# Patient Record
Sex: Female | Born: 1950 | Smoking: Never smoker
Health system: Southern US, Community
[De-identification: ages and names within clinical notes are randomized; demographics above are authoritative.]

## PROBLEM LIST (undated history)

## (undated) DIAGNOSIS — R011 Cardiac murmur, unspecified: Secondary | ICD-10-CM

## (undated) DIAGNOSIS — Z86718 Personal history of other venous thrombosis and embolism: Secondary | ICD-10-CM

## (undated) DIAGNOSIS — C55 Malignant neoplasm of uterus, part unspecified: Secondary | ICD-10-CM

## (undated) DIAGNOSIS — L97529 Non-pressure chronic ulcer of other part of left foot with unspecified severity: Secondary | ICD-10-CM

## (undated) DIAGNOSIS — K429 Umbilical hernia without obstruction or gangrene: Secondary | ICD-10-CM

## (undated) DIAGNOSIS — I10 Essential (primary) hypertension: Secondary | ICD-10-CM

## (undated) DIAGNOSIS — I4891 Unspecified atrial fibrillation: Secondary | ICD-10-CM

## (undated) DIAGNOSIS — E119 Type 2 diabetes mellitus without complications: Secondary | ICD-10-CM

## (undated) DIAGNOSIS — E785 Hyperlipidemia, unspecified: Secondary | ICD-10-CM

## (undated) HISTORY — DX: Unspecified atrial fibrillation: I48.91

## (undated) HISTORY — PX: BREAST LUMPECTOMY: SHX2

## (undated) HISTORY — DX: Malignant neoplasm of uterus, part unspecified: C55

## (undated) HISTORY — DX: Hyperlipidemia, unspecified: E78.5

## (undated) HISTORY — DX: Essential (primary) hypertension: I10

## (undated) HISTORY — DX: Personal history of other venous thrombosis and embolism: Z86.718

## (undated) HISTORY — DX: Non-pressure chronic ulcer of other part of left foot with unspecified severity: L97.529

## (undated) HISTORY — DX: Cardiac murmur, unspecified: R01.1

## (undated) HISTORY — PX: TOTAL ABDOMINAL HYSTERECTOMY: SHX209

## (undated) HISTORY — DX: Type 2 diabetes mellitus without complications: E11.9

## (undated) HISTORY — DX: Umbilical hernia without obstruction or gangrene: K42.9

---

## 2015-03-18 LAB — HEMOGLOBIN A1C: HEMOGLOBIN A1C: 9.1 % — AB (ref 4.0–6.0)

## 2015-05-15 ENCOUNTER — Encounter: Payer: Self-pay | Attending: "Endocrinology | Admitting: Nutrition

## 2015-05-15 ENCOUNTER — Encounter: Payer: Self-pay | Admitting: Nutrition

## 2015-05-15 VITALS — Ht 71.0 in | Wt 338.2 lb

## 2015-05-15 DIAGNOSIS — E118 Type 2 diabetes mellitus with unspecified complications: Principal | ICD-10-CM

## 2015-05-15 DIAGNOSIS — E1165 Type 2 diabetes mellitus with hyperglycemia: Secondary | ICD-10-CM

## 2015-05-15 DIAGNOSIS — IMO0002 Reserved for concepts with insufficient information to code with codable children: Secondary | ICD-10-CM

## 2015-05-15 NOTE — Patient Instructions (Signed)
Goals:  1.  Follow Plate Method as discussed. 2. Increase fresh fruits and vegetables. 3. Avoid snacks between meals. 4. Drink only water with meals. 5. Measure foods out for portion control. 6. Take insulin before meals and at bedtime as prescribed. Do not skip insulin doses. 7. Lose 1 lb per week til next visit. 8. Get A1C down to 7.5% in three months.

## 2015-05-15 NOTE — Progress Notes (Signed)
  Medical Nutrition Therapy:  Appt start time: 7741 end time:  1630.   Assessment:  Primary concerns today: Diabetes. And Obesitity. Test blood sugars 4 times per day. A1C was 9%. FBS   100-200's( 43); 300-400's (33 )times. Had ulcers on foot in past. Does the shopping and  Cooking. Afraid she can't avoids snacks and giving up her favorite foods to improve blood sugars. Says she has gained 28 lbs since going on insulin. Eats three meals and snacks occasionally but admits to larger portions. Doesn't exercise due to ulcer and osteomyelitis in foot. A1C was 9%. Diet is excessive calories, fat, sodium and low in fresh fruits and vegetables  Preferred Learning Style:  No preference indicated   Learning Readiness:   Not ready  Contemplating  Ready  Change in progress   MEDICATIONS:See list   DIETARY INTAKE:  24-hr recall:  B ( AM): Eggs, bagel, cream cheese, jelly, Coffee and milk 2%. Snk ( AM): none  L ( PM): Chicken noodle soup,  Snk ( PM): chips or snacks. D ( PM):Stirfry with shrimp, rice, vegetables.  Water or Unswt  Snk ( PM):  Beverages: water, unswet tea,  Usual physical activity: walkins some but ulcers on her feet with osteomyelitis  Estimated energy needs: 1500 calories 170 g carbohydrates 112 g protein 42 g fat  Progress Towards Goal(s):  In progress.   Nutritional Diagnosis:   NB-1.1 Food and nutrition-related knowledge deficit As related to Diabetes.  As evidenced by A1C 9%..    Intervention:  Nutrition counseling on diabetes, CHo counting, meal planning, portion sizes, exercise, complications of DM and treatments, target ranges for blood sugars, importance of foot and dental care, proper use of insulin and working on weight loss with low fat high fiber low sodium diet.  Goals:  1.  Follow Plate Method as discussed. 2. Increase fresh fruits and vegetables. 3. Avoid snacks between meals. 4. Drink only water with meals. 5. Measure foods out for portion  control. 6. Take insulin before meals and at bedtime as prescribed. Do not skip insulin doses. 7. Lose 1 lb per week til next visit. 8. Get A1C down to 7.5% in three months.  Teaching Method Utilized:  Visual Auditory Hands on  Handouts given during visit include:  The Plate Method  Meal Plan Card   Barriers to learning/adherence to lifestyle change: problems with her feet limit her to exercise some.  Demonstrated degree of understanding via:  Teach Back   Monitoring/Evaluation:  Dietary intake, exercise, meal planning, SBG, and body weight in 1 month(s).

## 2015-07-05 ENCOUNTER — Ambulatory Visit: Payer: Self-pay | Admitting: Nutrition

## 2015-07-08 LAB — HEMOGLOBIN A1C: HEMOGLOBIN A1C: 10.2 % — AB (ref 4.0–6.0)

## 2015-07-20 ENCOUNTER — Encounter: Payer: Self-pay | Admitting: "Endocrinology

## 2015-07-20 DIAGNOSIS — G4730 Sleep apnea, unspecified: Secondary | ICD-10-CM | POA: Insufficient documentation

## 2015-07-20 DIAGNOSIS — F411 Generalized anxiety disorder: Secondary | ICD-10-CM | POA: Insufficient documentation

## 2015-07-20 DIAGNOSIS — F32A Depression, unspecified: Secondary | ICD-10-CM | POA: Insufficient documentation

## 2015-07-20 DIAGNOSIS — F329 Major depressive disorder, single episode, unspecified: Secondary | ICD-10-CM | POA: Insufficient documentation

## 2015-09-12 ENCOUNTER — Other Ambulatory Visit: Payer: Self-pay | Admitting: "Endocrinology

## 2015-09-12 ENCOUNTER — Other Ambulatory Visit: Payer: Self-pay

## 2015-09-12 MED ORDER — INSULIN GLARGINE 100 UNIT/ML SOLOSTAR PEN: 60.0000 [IU] | PEN_INJECTOR | Freq: Every day | SUBCUTANEOUS | Status: DC

## 2015-10-03 ENCOUNTER — Other Ambulatory Visit: Payer: Self-pay | Admitting: *Deleted

## 2015-10-03 DIAGNOSIS — N186 End stage renal disease: Principal | ICD-10-CM

## 2015-10-03 DIAGNOSIS — E1122 Type 2 diabetes mellitus with diabetic chronic kidney disease: Secondary | ICD-10-CM

## 2015-10-03 MED ORDER — BD PEN NEEDLE SHORT U/F 31G X 8 MM MISC: Status: DC

## 2015-10-04 ENCOUNTER — Other Ambulatory Visit: Payer: Self-pay

## 2015-10-04 MED ORDER — INSULIN PEN NEEDLE 31G X 4 MM MISC: 1.0000 | Freq: Four times a day (QID) | Status: AC

## 2015-10-18 ENCOUNTER — Ambulatory Visit: Payer: Self-pay | Admitting: "Endocrinology

## 2015-10-28 ENCOUNTER — Ambulatory Visit: Payer: Self-pay | Admitting: "Endocrinology

## 2015-10-30 LAB — HEMOGLOBIN A1C: Hgb A1c MFr Bld: 9.6 % — AB (ref 4.0–6.0)

## 2015-11-11 ENCOUNTER — Encounter: Payer: Self-pay | Admitting: "Endocrinology

## 2015-11-11 ENCOUNTER — Ambulatory Visit (INDEPENDENT_AMBULATORY_CARE_PROVIDER_SITE_OTHER): Payer: BLUE CROSS/BLUE SHIELD | Admitting: "Endocrinology

## 2015-11-11 VITALS — BP 162/101 | HR 69 | Ht 71.0 in | Wt 346.0 lb

## 2015-11-11 DIAGNOSIS — N181 Chronic kidney disease, stage 1: Secondary | ICD-10-CM

## 2015-11-11 DIAGNOSIS — E1122 Type 2 diabetes mellitus with diabetic chronic kidney disease: Secondary | ICD-10-CM | POA: Insufficient documentation

## 2015-11-11 DIAGNOSIS — I10 Essential (primary) hypertension: Secondary | ICD-10-CM | POA: Diagnosis not present

## 2015-11-11 DIAGNOSIS — Z9119 Patient's noncompliance with other medical treatment and regimen: Secondary | ICD-10-CM

## 2015-11-11 DIAGNOSIS — Z794 Long term (current) use of insulin: Secondary | ICD-10-CM | POA: Diagnosis not present

## 2015-11-11 DIAGNOSIS — Z91199 Patient's noncompliance with other medical treatment and regimen due to unspecified reason: Secondary | ICD-10-CM | POA: Insufficient documentation

## 2015-11-11 MED ORDER — INSULIN GLARGINE 100 UNIT/ML SOLOSTAR PEN: 70.0000 [IU] | PEN_INJECTOR | Freq: Every day | SUBCUTANEOUS | Status: DC

## 2015-11-11 MED ORDER — GLUCOSE BLOOD VI STRP: ORAL_STRIP | Status: AC

## 2015-11-11 MED ORDER — INSULIN GLARGINE 100 UNIT/ML SOLOSTAR PEN: 70.0000 [IU] | PEN_INJECTOR | Freq: Every day | SUBCUTANEOUS | Status: AC

## 2015-11-11 NOTE — Progress Notes (Signed)
Subjective:    Patient ID: Angelica Wilson, female    DOB: [birth year], PCP Josem Kaufmann, MD   Past Medical History  Diagnosis Date  . Diabetes mellitus without complication ([place])   . Hyperlipidemia   . Hypertension   . Atrial fibrillation ([place])   . Ulcer of left foot ([place])   . Hernia, umbilical   . Cardiac murmur   . Uterine cancer ([place])   . Hx of blood clots    Past Surgical History  Procedure Laterality Date  . Breast lumpectomy    . Total abdominal hysterectomy     Social History   Social History  . Marital Status: Unknown    Spouse Name: N/A  . Number of Children: N/A  . Years of Education: N/A   Social History Main Topics  . Smoking status: Never Smoker   . Smokeless tobacco: None  . Alcohol Use: No  . Drug Use: No  . Sexual Activity: Not Asked   Other Topics Concern  . None   Social History Narrative   Outpatient Encounter Prescriptions as of 11/11/2015  Medication Sig  . Alpha-Lipoic Acid 600 MG CAPS Take by mouth daily.  Marland Kitchen ALPRAZolam (XANAX) 0.5 MG tablet Take 0.5 mg by mouth 3 (three) times daily as needed for anxiety.  Marland Kitchen amLODipine (NORVASC) 5 MG tablet Take 5 mg by mouth daily.  Marland Kitchen aspirin 81 MG tablet Take 81 mg by mouth daily.  . Cholecalciferol (VITAMIN D3) 2000 UNITS TABS Take 6,000 Units by mouth daily.   . hydrALAZINE (APRESOLINE) 25 MG tablet Take 25 mg by mouth 2 (two) times daily as needed.  . hydrochlorothiazide (HYDRODIURIL) 12.5 MG tablet Take 12.5 mg by mouth daily.  . Insulin Glargine (LANTUS SOLOSTAR) 100 UNIT/ML Solostar Pen Inject 70 Units into the skin at bedtime.  . insulin lispro (HUMALOG) 100 UNIT/ML injection Inject 18-24 Units into the skin 3 (three) times daily before meals.  . Insulin Pen Needle 31G X 4 MM MISC 1 each by Does not apply route 4 (four) times daily. Use 4 x daily  . l-methylfolate-B6-B12 (METANX) 3-35-2 MG TABS Take 1 tablet by mouth 2 (two) times daily.  . Lactobacillus-Inulin ([place])  Take by mouth daily.  . metFORMIN (GLUCOPHAGE) 500 MG tablet Take by mouth 2 (two) times daily with a meal.  . rivaroxaban (XARELTO) 20 MG TABS tablet Take 20 mg by mouth daily with supper.  . valsartan-hydrochlorothiazide (DIOVAN-HCT) 320-12.5 MG per tablet Take 1 tablet by mouth daily.  . vitamin C (ASCORBIC ACID) 500 MG tablet Take 500 mg by mouth 3 (three) times daily.  . [DISCONTINUED] Insulin Glargine (LANTUS SOLOSTAR) 100 UNIT/ML Solostar Pen Inject 60 Units into the skin at bedtime.  . [DISCONTINUED] Insulin Glargine (LANTUS SOLOSTAR) 100 UNIT/ML Solostar Pen Inject 70 Units into the skin at bedtime.  . [DISCONTINUED] Insulin Glargine (LANTUS SOLOSTAR) 100 UNIT/ML Solostar Pen Inject 70 Units into the skin at bedtime.  . [DISCONTINUED] Insulin Glargine (LANTUS SOLOSTAR) 100 UNIT/ML Solostar Pen Inject 70 Units into the skin at bedtime.  Marland Kitchen glucose blood test strip Use as instructed   No facility-administered encounter medications on file as of 11/11/2015.   ALLERGIES: No Known Allergies VACCINATION STATUS: Immunization History  Administered Date(s) Administered  . Influenza-Unspecified 07/19/2014    Diabetes She presents for her follow-up diabetic visit. She has type 2 diabetes mellitus. Onset time: She was diagnosed at approximate age of 13 years. Her disease course has been improving. There  are no hypoglycemic associated symptoms. Pertinent negatives for hypoglycemia include no confusion, headaches, pallor or seizures. Associated symptoms include fatigue, polydipsia and polyuria. Pertinent negatives for diabetes include no chest pain and no polyphagia. There are no hypoglycemic complications. Symptoms are improving. Diabetic complications include nephropathy and peripheral neuropathy. Risk factors for coronary artery disease include diabetes mellitus, dyslipidemia, obesity and sedentary lifestyle. Current diabetic treatment includes intensive insulin program and oral agent  (monotherapy). She is compliant with treatment some of the time. Her weight is stable. She is following a generally unhealthy diet. When asked about meal planning, she reported none. Prior visit with dietitian: She declines dietitian referral. Her home blood glucose trend is fluctuating dramatically. Her breakfast blood glucose range is generally >200 mg/dl. Her lunch blood glucose range is generally >200 mg/dl. Her dinner blood glucose range is generally >200 mg/dl. Her overall blood glucose range is >200 mg/dl. An ACE inhibitor/angiotensin II receptor blocker is being taken. Eye exam is current.  Hyperlipidemia This is a chronic problem. The current episode started more than 1 year ago. Pertinent negatives include no chest pain, leg pain, myalgias or shortness of breath. Risk factors for coronary artery disease include dyslipidemia, diabetes mellitus, hypertension, obesity and a sedentary lifestyle.  Hypertension This is a chronic problem. The current episode started more than 1 year ago. Pertinent negatives include no chest pain, headaches, palpitations or shortness of breath. Risk factors for coronary artery disease include dyslipidemia, diabetes mellitus, obesity and sedentary lifestyle. Past treatments include angiotensin blockers. Compliance problems include diet, exercise and psychosocial issues.  Hypertensive end-organ damage includes kidney disease.     Review of Systems  Constitutional: Positive for fatigue. Negative for unexpected weight change.  HENT: Negative for trouble swallowing and voice change.   Eyes: Negative for visual disturbance.  Respiratory: Negative for cough, shortness of breath and wheezing.   Cardiovascular: Negative for chest pain, palpitations and leg swelling.  Gastrointestinal: Negative for nausea, vomiting and diarrhea.  Endocrine: Positive for polydipsia and polyuria. Negative for cold intolerance, heat intolerance and polyphagia.  Musculoskeletal: Negative for  myalgias and arthralgias.  Skin: Negative for color change, pallor, rash and wound.  Neurological: Negative for seizures and headaches.  Psychiatric/Behavioral: Negative for suicidal ideas and confusion.    Objective:    BP 162/101 mmHg  Pulse 69  Ht 5\' 11"  (1.803 m)  Wt 346 lb (156.945 kg)  BMI 48.28 kg/m2  SpO2 97%  Wt Readings from Last 3 Encounters:  11/11/15 346 lb (156.945 kg)  07/18/15 330 lb (149.687 kg)  05/15/15 338 lb 3.2 oz (153.407 kg)    Physical Exam  Constitutional: She is oriented to person, place, and time. She appears well-developed.  HENT:  Head: Normocephalic and atraumatic.  Eyes: EOM are normal.  Neck: Normal range of motion. Neck supple. No tracheal deviation present. No thyromegaly present.  Cardiovascular: Normal rate and regular rhythm.   Pulmonary/Chest: Effort normal and breath sounds normal.  Abdominal: Soft. Bowel sounds are normal. There is no tenderness. There is no guarding.  Musculoskeletal: Normal range of motion. She exhibits no edema.       Right ankle: She exhibits swelling.       Left ankle: She exhibits swelling.       Right foot: There is swelling.       Left foot: There is swelling.  Neurological: She is alert and oriented to person, place, and time. She has normal reflexes. No cranial nerve deficit. Coordination normal.  Skin: Skin is warm  and dry. No rash noted. No erythema. No pallor.  Psychiatric: She has a normal mood and affect. Judgment normal.    Results for orders placed or performed in visit on 11/11/15  Hemoglobin A1c  Result Value Ref Range   Hgb A1c MFr Bld 9.6 (A) 4.0 - 6.0 %   Complete Blood Count (Most recent): No results found for: WBC, HGB, HCT, MCV, PLT Chemistry (most recent): No results found for: NA, K, CL, CO2, BUN, CREATININE, GLUF Diabetic Labs (most recent): Lab Results  Component Value Date   HGBA1C 9.6* 10/30/2015   HGBA1C 10.2* 07/08/2015   HGBA1C 9.1* 03/18/2015     Assessment & Plan:    1. Type 2 diabetes mellitus with stage 1 chronic kidney disease, with long-term current use of insulin (HCC)   - Her diabetes is  complicated by stage II CK D and patient remains at a high risk for more acute and chronic complications of diabetes which include CAD, CVA, CKD, retinopathy, and neuropathy. These are all discussed in detail with the patient.  Patient came with improved but still persistently above target glucose profile, and  recent A1c of 9.6 %.  Glucose logs and insulin administration records pertaining to this visit,  to be scanned into patient's records.  Recent labs reviewed.   - I have re-counseled the patient on diet management and weight loss  by adopting a carbohydrate restricted / protein rich  Diet.  - Suggestion is made for patient to avoid simple carbohydrates   from their diet including Cakes , Desserts, Ice Cream,  Soda (  diet and regular) , Sweet Tea , Candies,  Chips, Cookies, Artificial Sweeteners,   and "Sugar-free" Products .  This will help patient to have stable blood glucose profile and potentially avoid unintended  Weight gain.  - Patient is advised to stick to a routine mealtimes to eat 3 meals  a day and avoid unnecessary snacks ( to snack only to correct hypoglycemia).  - The patient  declined referral to a CDE for individualized DM education.  - I have approached patient with the following individualized plan to manage diabetes and patient agrees.  -I advised her to increase Lantus to 70 units qhs, and prandial insulin Novolog to 18 units TIDAC for pre-meal BG readings of 90-150mg /dl, plus patient specific correction dose of rapid acting insulin for unexpected hyperglycemia above 150mg /dl, associated with strict monitoring of BG AC and HS.  She has to commit to strict monitoring before increasing insulin for safe use. -Adjustment parameters for hypo and hyperglycemia were given in a written document to patient. -Patient is encouraged to call clinic  for blood glucose levels less than 70 or above 300 mg /dl. -She will be considered for incretin therapy once compliance is assured.  I advised her to continue MTF 500mg  po BID.   - Patient specific target  for A1c; LDL, HDL, Triglycerides, and  Waist Circumference were discussed in detail.  2) BP/HTNuncontrolled. Continue current medications including ACEI/ARB. 3) Lipids/HPL:  She is not on statins, will consider next visit. 4)  Weight/Diet: CDE consult in progress, exercise, and carbohydrates information provided.  5) Personal history of noncompliance with medical treatment, presenting hazards to health -He is counseled extensively to be more engaged in the care of diabetes to prevent competitions.  6) Chronic Care/Health Maintenance:  -Patient is on ACEI/ARB  and encouraged to continue to follow up with Ophthalmology, Podiatrist at least yearly or according to recommendations, and advised to  stay  away from smoking. I have recommended yearly flu vaccine and pneumonia vaccination at least every 5 years; moderate intensity exercise for up to 150 minutes weekly; and  sleep for at least 7 hours a day.  - 25 minutes of time was spent on the care of this patient , 50% of which was applied for counseling on diabetes complications and their preventions.  - I advised patient to maintain close follow up with [facility], MD for primary care needs.  Patient is asked to bring meter and  blood glucose logs during their next visit.   Follow up plan: -Return in about 3 months (around 02/09/2016) for diabetes, high blood pressure, high cholesterol, follow up with pre-visit labs, meter, and logs.  Glade Lloyd, MD Phone: [phone]  Fax: [phone]   11/11/2015, 7:58 PM

## 2015-11-11 NOTE — Patient Instructions (Signed)

## 2015-11-19 ENCOUNTER — Other Ambulatory Visit: Payer: Self-pay | Admitting: "Endocrinology

## 2015-12-05 DIAGNOSIS — G629 Polyneuropathy, unspecified: Secondary | ICD-10-CM | POA: Diagnosis not present

## 2015-12-05 DIAGNOSIS — Z91030 Bee allergy status: Secondary | ICD-10-CM | POA: Diagnosis not present

## 2015-12-05 DIAGNOSIS — E11622 Type 2 diabetes mellitus with other skin ulcer: Secondary | ICD-10-CM | POA: Diagnosis not present

## 2015-12-05 DIAGNOSIS — I10 Essential (primary) hypertension: Secondary | ICD-10-CM | POA: Diagnosis not present

## 2015-12-05 DIAGNOSIS — L97321 Non-pressure chronic ulcer of left ankle limited to breakdown of skin: Secondary | ICD-10-CM | POA: Diagnosis not present

## 2015-12-05 DIAGNOSIS — I519 Heart disease, unspecified: Secondary | ICD-10-CM | POA: Diagnosis not present

## 2015-12-05 DIAGNOSIS — E1169 Type 2 diabetes mellitus with other specified complication: Secondary | ICD-10-CM | POA: Diagnosis not present

## 2015-12-05 DIAGNOSIS — Z7901 Long term (current) use of anticoagulants: Secondary | ICD-10-CM | POA: Diagnosis not present

## 2015-12-05 DIAGNOSIS — Z794 Long term (current) use of insulin: Secondary | ICD-10-CM | POA: Diagnosis not present

## 2015-12-05 DIAGNOSIS — L97221 Non-pressure chronic ulcer of left calf limited to breakdown of skin: Secondary | ICD-10-CM | POA: Diagnosis not present

## 2015-12-05 DIAGNOSIS — L97211 Non-pressure chronic ulcer of right calf limited to breakdown of skin: Secondary | ICD-10-CM | POA: Diagnosis not present

## 2015-12-05 DIAGNOSIS — E11621 Type 2 diabetes mellitus with foot ulcer: Secondary | ICD-10-CM | POA: Diagnosis not present

## 2015-12-05 DIAGNOSIS — L97311 Non-pressure chronic ulcer of right ankle limited to breakdown of skin: Secondary | ICD-10-CM | POA: Diagnosis not present

## 2015-12-05 DIAGNOSIS — Z792 Long term (current) use of antibiotics: Secondary | ICD-10-CM | POA: Diagnosis not present

## 2015-12-05 DIAGNOSIS — I872 Venous insufficiency (chronic) (peripheral): Secondary | ICD-10-CM | POA: Diagnosis not present

## 2015-12-06 DIAGNOSIS — E11622 Type 2 diabetes mellitus with other skin ulcer: Secondary | ICD-10-CM | POA: Diagnosis not present

## 2015-12-06 DIAGNOSIS — L97211 Non-pressure chronic ulcer of right calf limited to breakdown of skin: Secondary | ICD-10-CM | POA: Diagnosis not present

## 2015-12-06 DIAGNOSIS — I872 Venous insufficiency (chronic) (peripheral): Secondary | ICD-10-CM | POA: Diagnosis not present

## 2015-12-06 DIAGNOSIS — L97221 Non-pressure chronic ulcer of left calf limited to breakdown of skin: Secondary | ICD-10-CM | POA: Diagnosis not present

## 2015-12-06 DIAGNOSIS — E11621 Type 2 diabetes mellitus with foot ulcer: Secondary | ICD-10-CM | POA: Diagnosis not present

## 2015-12-06 DIAGNOSIS — E1169 Type 2 diabetes mellitus with other specified complication: Secondary | ICD-10-CM | POA: Diagnosis not present

## 2015-12-06 DIAGNOSIS — L97311 Non-pressure chronic ulcer of right ankle limited to breakdown of skin: Secondary | ICD-10-CM | POA: Diagnosis not present

## 2015-12-06 DIAGNOSIS — L97321 Non-pressure chronic ulcer of left ankle limited to breakdown of skin: Secondary | ICD-10-CM | POA: Diagnosis not present

## 2015-12-11 DIAGNOSIS — E11621 Type 2 diabetes mellitus with foot ulcer: Secondary | ICD-10-CM | POA: Diagnosis not present

## 2015-12-11 DIAGNOSIS — E1169 Type 2 diabetes mellitus with other specified complication: Secondary | ICD-10-CM | POA: Diagnosis not present

## 2015-12-11 DIAGNOSIS — L97321 Non-pressure chronic ulcer of left ankle limited to breakdown of skin: Secondary | ICD-10-CM | POA: Diagnosis not present

## 2015-12-11 DIAGNOSIS — L97311 Non-pressure chronic ulcer of right ankle limited to breakdown of skin: Secondary | ICD-10-CM | POA: Diagnosis not present

## 2015-12-11 DIAGNOSIS — E11622 Type 2 diabetes mellitus with other skin ulcer: Secondary | ICD-10-CM | POA: Diagnosis not present

## 2015-12-11 DIAGNOSIS — L97211 Non-pressure chronic ulcer of right calf limited to breakdown of skin: Secondary | ICD-10-CM | POA: Diagnosis not present

## 2015-12-11 DIAGNOSIS — I872 Venous insufficiency (chronic) (peripheral): Secondary | ICD-10-CM | POA: Diagnosis not present

## 2015-12-11 DIAGNOSIS — L97221 Non-pressure chronic ulcer of left calf limited to breakdown of skin: Secondary | ICD-10-CM | POA: Diagnosis not present

## 2015-12-14 DIAGNOSIS — Z7901 Long term (current) use of anticoagulants: Secondary | ICD-10-CM | POA: Diagnosis not present

## 2015-12-14 DIAGNOSIS — Z8542 Personal history of malignant neoplasm of other parts of uterus: Secondary | ICD-10-CM | POA: Diagnosis not present

## 2015-12-14 DIAGNOSIS — E7800 Pure hypercholesterolemia, unspecified: Secondary | ICD-10-CM | POA: Diagnosis not present

## 2015-12-14 DIAGNOSIS — Z794 Long term (current) use of insulin: Secondary | ICD-10-CM | POA: Diagnosis not present

## 2015-12-14 DIAGNOSIS — E119 Type 2 diabetes mellitus without complications: Secondary | ICD-10-CM | POA: Diagnosis not present

## 2015-12-14 DIAGNOSIS — F329 Major depressive disorder, single episode, unspecified: Secondary | ICD-10-CM | POA: Diagnosis not present

## 2015-12-14 DIAGNOSIS — I4891 Unspecified atrial fibrillation: Secondary | ICD-10-CM | POA: Diagnosis not present

## 2015-12-14 DIAGNOSIS — L03031 Cellulitis of right toe: Secondary | ICD-10-CM | POA: Diagnosis not present

## 2015-12-14 DIAGNOSIS — Z79899 Other long term (current) drug therapy: Secondary | ICD-10-CM | POA: Diagnosis not present

## 2015-12-14 DIAGNOSIS — Z86718 Personal history of other venous thrombosis and embolism: Secondary | ICD-10-CM | POA: Diagnosis not present

## 2015-12-14 DIAGNOSIS — Z91030 Bee allergy status: Secondary | ICD-10-CM | POA: Diagnosis not present

## 2015-12-17 DIAGNOSIS — L97221 Non-pressure chronic ulcer of left calf limited to breakdown of skin: Secondary | ICD-10-CM | POA: Diagnosis not present

## 2015-12-17 DIAGNOSIS — E11622 Type 2 diabetes mellitus with other skin ulcer: Secondary | ICD-10-CM | POA: Diagnosis not present

## 2015-12-17 DIAGNOSIS — E11621 Type 2 diabetes mellitus with foot ulcer: Secondary | ICD-10-CM | POA: Diagnosis not present

## 2015-12-17 DIAGNOSIS — E1169 Type 2 diabetes mellitus with other specified complication: Secondary | ICD-10-CM | POA: Diagnosis not present

## 2015-12-17 DIAGNOSIS — L97321 Non-pressure chronic ulcer of left ankle limited to breakdown of skin: Secondary | ICD-10-CM | POA: Diagnosis not present

## 2015-12-17 DIAGNOSIS — I872 Venous insufficiency (chronic) (peripheral): Secondary | ICD-10-CM | POA: Diagnosis not present

## 2015-12-17 DIAGNOSIS — L97311 Non-pressure chronic ulcer of right ankle limited to breakdown of skin: Secondary | ICD-10-CM | POA: Diagnosis not present

## 2015-12-17 DIAGNOSIS — L97211 Non-pressure chronic ulcer of right calf limited to breakdown of skin: Secondary | ICD-10-CM | POA: Diagnosis not present

## 2015-12-23 DIAGNOSIS — L97221 Non-pressure chronic ulcer of left calf limited to breakdown of skin: Secondary | ICD-10-CM | POA: Diagnosis not present

## 2015-12-23 DIAGNOSIS — L97311 Non-pressure chronic ulcer of right ankle limited to breakdown of skin: Secondary | ICD-10-CM | POA: Diagnosis not present

## 2015-12-23 DIAGNOSIS — L97321 Non-pressure chronic ulcer of left ankle limited to breakdown of skin: Secondary | ICD-10-CM | POA: Diagnosis not present

## 2015-12-23 DIAGNOSIS — E1169 Type 2 diabetes mellitus with other specified complication: Secondary | ICD-10-CM | POA: Diagnosis not present

## 2015-12-23 DIAGNOSIS — E11621 Type 2 diabetes mellitus with foot ulcer: Secondary | ICD-10-CM | POA: Diagnosis not present

## 2015-12-23 DIAGNOSIS — L97211 Non-pressure chronic ulcer of right calf limited to breakdown of skin: Secondary | ICD-10-CM | POA: Diagnosis not present

## 2015-12-23 DIAGNOSIS — E11622 Type 2 diabetes mellitus with other skin ulcer: Secondary | ICD-10-CM | POA: Diagnosis not present

## 2015-12-23 DIAGNOSIS — I872 Venous insufficiency (chronic) (peripheral): Secondary | ICD-10-CM | POA: Diagnosis not present

## 2015-12-30 DIAGNOSIS — E11621 Type 2 diabetes mellitus with foot ulcer: Secondary | ICD-10-CM | POA: Diagnosis not present

## 2015-12-30 DIAGNOSIS — E11622 Type 2 diabetes mellitus with other skin ulcer: Secondary | ICD-10-CM | POA: Diagnosis not present

## 2015-12-30 DIAGNOSIS — L97321 Non-pressure chronic ulcer of left ankle limited to breakdown of skin: Secondary | ICD-10-CM | POA: Diagnosis not present

## 2015-12-30 DIAGNOSIS — L97221 Non-pressure chronic ulcer of left calf limited to breakdown of skin: Secondary | ICD-10-CM | POA: Diagnosis not present

## 2015-12-30 DIAGNOSIS — E1169 Type 2 diabetes mellitus with other specified complication: Secondary | ICD-10-CM | POA: Diagnosis not present

## 2015-12-30 DIAGNOSIS — L97211 Non-pressure chronic ulcer of right calf limited to breakdown of skin: Secondary | ICD-10-CM | POA: Diagnosis not present

## 2015-12-30 DIAGNOSIS — I872 Venous insufficiency (chronic) (peripheral): Secondary | ICD-10-CM | POA: Diagnosis not present

## 2015-12-30 DIAGNOSIS — L97311 Non-pressure chronic ulcer of right ankle limited to breakdown of skin: Secondary | ICD-10-CM | POA: Diagnosis not present

## 2016-01-06 DIAGNOSIS — Z794 Long term (current) use of insulin: Secondary | ICD-10-CM | POA: Diagnosis not present

## 2016-01-06 DIAGNOSIS — Z7901 Long term (current) use of anticoagulants: Secondary | ICD-10-CM | POA: Diagnosis not present

## 2016-01-06 DIAGNOSIS — Z792 Long term (current) use of antibiotics: Secondary | ICD-10-CM | POA: Diagnosis not present

## 2016-01-06 DIAGNOSIS — Z91030 Bee allergy status: Secondary | ICD-10-CM | POA: Diagnosis not present

## 2016-01-06 DIAGNOSIS — I10 Essential (primary) hypertension: Secondary | ICD-10-CM | POA: Diagnosis not present

## 2016-01-06 DIAGNOSIS — I872 Venous insufficiency (chronic) (peripheral): Secondary | ICD-10-CM | POA: Diagnosis not present

## 2016-01-06 DIAGNOSIS — E1169 Type 2 diabetes mellitus with other specified complication: Secondary | ICD-10-CM | POA: Diagnosis not present

## 2016-01-06 DIAGNOSIS — L97529 Non-pressure chronic ulcer of other part of left foot with unspecified severity: Secondary | ICD-10-CM | POA: Diagnosis not present

## 2016-01-06 DIAGNOSIS — E11622 Type 2 diabetes mellitus with other skin ulcer: Secondary | ICD-10-CM | POA: Diagnosis not present

## 2016-01-06 DIAGNOSIS — I519 Heart disease, unspecified: Secondary | ICD-10-CM | POA: Diagnosis not present

## 2016-01-06 DIAGNOSIS — G629 Polyneuropathy, unspecified: Secondary | ICD-10-CM | POA: Diagnosis not present

## 2016-01-13 DIAGNOSIS — I10 Essential (primary) hypertension: Secondary | ICD-10-CM | POA: Diagnosis not present

## 2016-01-13 DIAGNOSIS — I519 Heart disease, unspecified: Secondary | ICD-10-CM | POA: Diagnosis not present

## 2016-01-13 DIAGNOSIS — I872 Venous insufficiency (chronic) (peripheral): Secondary | ICD-10-CM | POA: Diagnosis not present

## 2016-01-13 DIAGNOSIS — G629 Polyneuropathy, unspecified: Secondary | ICD-10-CM | POA: Diagnosis not present

## 2016-01-13 DIAGNOSIS — E11622 Type 2 diabetes mellitus with other skin ulcer: Secondary | ICD-10-CM | POA: Diagnosis not present

## 2016-01-13 DIAGNOSIS — E1169 Type 2 diabetes mellitus with other specified complication: Secondary | ICD-10-CM | POA: Diagnosis not present

## 2016-01-13 DIAGNOSIS — L97529 Non-pressure chronic ulcer of other part of left foot with unspecified severity: Secondary | ICD-10-CM | POA: Diagnosis not present

## 2016-01-20 DIAGNOSIS — E11622 Type 2 diabetes mellitus with other skin ulcer: Secondary | ICD-10-CM | POA: Diagnosis not present

## 2016-01-20 DIAGNOSIS — G629 Polyneuropathy, unspecified: Secondary | ICD-10-CM | POA: Diagnosis not present

## 2016-01-20 DIAGNOSIS — I872 Venous insufficiency (chronic) (peripheral): Secondary | ICD-10-CM | POA: Diagnosis not present

## 2016-01-20 DIAGNOSIS — E1169 Type 2 diabetes mellitus with other specified complication: Secondary | ICD-10-CM | POA: Diagnosis not present

## 2016-01-20 DIAGNOSIS — L97529 Non-pressure chronic ulcer of other part of left foot with unspecified severity: Secondary | ICD-10-CM | POA: Diagnosis not present

## 2016-01-20 DIAGNOSIS — I10 Essential (primary) hypertension: Secondary | ICD-10-CM | POA: Diagnosis not present

## 2016-01-20 DIAGNOSIS — I519 Heart disease, unspecified: Secondary | ICD-10-CM | POA: Diagnosis not present

## 2016-01-27 DIAGNOSIS — L97529 Non-pressure chronic ulcer of other part of left foot with unspecified severity: Secondary | ICD-10-CM | POA: Diagnosis not present

## 2016-01-27 DIAGNOSIS — E1169 Type 2 diabetes mellitus with other specified complication: Secondary | ICD-10-CM | POA: Diagnosis not present

## 2016-01-27 DIAGNOSIS — I519 Heart disease, unspecified: Secondary | ICD-10-CM | POA: Diagnosis not present

## 2016-01-27 DIAGNOSIS — E11622 Type 2 diabetes mellitus with other skin ulcer: Secondary | ICD-10-CM | POA: Diagnosis not present

## 2016-01-27 DIAGNOSIS — I872 Venous insufficiency (chronic) (peripheral): Secondary | ICD-10-CM | POA: Diagnosis not present

## 2016-01-27 DIAGNOSIS — I10 Essential (primary) hypertension: Secondary | ICD-10-CM | POA: Diagnosis not present

## 2016-01-27 DIAGNOSIS — G629 Polyneuropathy, unspecified: Secondary | ICD-10-CM | POA: Diagnosis not present

## 2016-02-03 DIAGNOSIS — G629 Polyneuropathy, unspecified: Secondary | ICD-10-CM | POA: Diagnosis not present

## 2016-02-03 DIAGNOSIS — L97521 Non-pressure chronic ulcer of other part of left foot limited to breakdown of skin: Secondary | ICD-10-CM | POA: Diagnosis not present

## 2016-02-03 DIAGNOSIS — Z91030 Bee allergy status: Secondary | ICD-10-CM | POA: Diagnosis not present

## 2016-02-03 DIAGNOSIS — E669 Obesity, unspecified: Secondary | ICD-10-CM | POA: Diagnosis not present

## 2016-02-03 DIAGNOSIS — E1169 Type 2 diabetes mellitus with other specified complication: Secondary | ICD-10-CM | POA: Diagnosis not present

## 2016-02-03 DIAGNOSIS — I519 Heart disease, unspecified: Secondary | ICD-10-CM | POA: Diagnosis not present

## 2016-02-03 DIAGNOSIS — E11622 Type 2 diabetes mellitus with other skin ulcer: Secondary | ICD-10-CM | POA: Diagnosis not present

## 2016-02-03 DIAGNOSIS — Z7901 Long term (current) use of anticoagulants: Secondary | ICD-10-CM | POA: Diagnosis not present

## 2016-02-03 DIAGNOSIS — I10 Essential (primary) hypertension: Secondary | ICD-10-CM | POA: Diagnosis not present

## 2016-02-03 DIAGNOSIS — Z794 Long term (current) use of insulin: Secondary | ICD-10-CM | POA: Diagnosis not present

## 2016-02-03 DIAGNOSIS — Z792 Long term (current) use of antibiotics: Secondary | ICD-10-CM | POA: Diagnosis not present

## 2016-02-03 DIAGNOSIS — I872 Venous insufficiency (chronic) (peripheral): Secondary | ICD-10-CM | POA: Diagnosis not present

## 2016-02-07 DIAGNOSIS — L97521 Non-pressure chronic ulcer of other part of left foot limited to breakdown of skin: Secondary | ICD-10-CM | POA: Diagnosis not present

## 2016-02-07 DIAGNOSIS — E1169 Type 2 diabetes mellitus with other specified complication: Secondary | ICD-10-CM | POA: Diagnosis not present

## 2016-02-07 DIAGNOSIS — I10 Essential (primary) hypertension: Secondary | ICD-10-CM | POA: Diagnosis not present

## 2016-02-07 DIAGNOSIS — E11622 Type 2 diabetes mellitus with other skin ulcer: Secondary | ICD-10-CM | POA: Diagnosis not present

## 2016-02-07 DIAGNOSIS — I872 Venous insufficiency (chronic) (peripheral): Secondary | ICD-10-CM | POA: Diagnosis not present

## 2016-02-07 DIAGNOSIS — I519 Heart disease, unspecified: Secondary | ICD-10-CM | POA: Diagnosis not present

## 2016-02-07 DIAGNOSIS — Z7901 Long term (current) use of anticoagulants: Secondary | ICD-10-CM | POA: Diagnosis not present

## 2016-02-12 ENCOUNTER — Ambulatory Visit: Payer: BLUE CROSS/BLUE SHIELD | Admitting: "Endocrinology

## 2016-02-17 DIAGNOSIS — Z7901 Long term (current) use of anticoagulants: Secondary | ICD-10-CM | POA: Diagnosis not present

## 2016-02-17 DIAGNOSIS — L97521 Non-pressure chronic ulcer of other part of left foot limited to breakdown of skin: Secondary | ICD-10-CM | POA: Diagnosis not present

## 2016-02-17 DIAGNOSIS — E11622 Type 2 diabetes mellitus with other skin ulcer: Secondary | ICD-10-CM | POA: Diagnosis not present

## 2016-02-17 DIAGNOSIS — I10 Essential (primary) hypertension: Secondary | ICD-10-CM | POA: Diagnosis not present

## 2016-02-17 DIAGNOSIS — I519 Heart disease, unspecified: Secondary | ICD-10-CM | POA: Diagnosis not present

## 2016-02-17 DIAGNOSIS — I872 Venous insufficiency (chronic) (peripheral): Secondary | ICD-10-CM | POA: Diagnosis not present

## 2016-02-17 DIAGNOSIS — E1169 Type 2 diabetes mellitus with other specified complication: Secondary | ICD-10-CM | POA: Diagnosis not present

## 2016-02-24 DIAGNOSIS — Z7901 Long term (current) use of anticoagulants: Secondary | ICD-10-CM | POA: Diagnosis not present

## 2016-02-24 DIAGNOSIS — I872 Venous insufficiency (chronic) (peripheral): Secondary | ICD-10-CM | POA: Diagnosis not present

## 2016-02-24 DIAGNOSIS — E11622 Type 2 diabetes mellitus with other skin ulcer: Secondary | ICD-10-CM | POA: Diagnosis not present

## 2016-02-24 DIAGNOSIS — I519 Heart disease, unspecified: Secondary | ICD-10-CM | POA: Diagnosis not present

## 2016-02-24 DIAGNOSIS — L97521 Non-pressure chronic ulcer of other part of left foot limited to breakdown of skin: Secondary | ICD-10-CM | POA: Diagnosis not present

## 2016-02-24 DIAGNOSIS — I10 Essential (primary) hypertension: Secondary | ICD-10-CM | POA: Diagnosis not present

## 2016-02-24 DIAGNOSIS — E1169 Type 2 diabetes mellitus with other specified complication: Secondary | ICD-10-CM | POA: Diagnosis not present

## 2016-03-02 DIAGNOSIS — E11621 Type 2 diabetes mellitus with foot ulcer: Secondary | ICD-10-CM | POA: Diagnosis not present

## 2016-03-02 DIAGNOSIS — Z7901 Long term (current) use of anticoagulants: Secondary | ICD-10-CM | POA: Diagnosis not present

## 2016-03-02 DIAGNOSIS — Z794 Long term (current) use of insulin: Secondary | ICD-10-CM | POA: Diagnosis not present

## 2016-03-02 DIAGNOSIS — E1169 Type 2 diabetes mellitus with other specified complication: Secondary | ICD-10-CM | POA: Diagnosis not present

## 2016-03-02 DIAGNOSIS — I519 Heart disease, unspecified: Secondary | ICD-10-CM | POA: Diagnosis not present

## 2016-03-02 DIAGNOSIS — Z91030 Bee allergy status: Secondary | ICD-10-CM | POA: Diagnosis not present

## 2016-03-02 DIAGNOSIS — I10 Essential (primary) hypertension: Secondary | ICD-10-CM | POA: Diagnosis not present

## 2016-03-02 DIAGNOSIS — I872 Venous insufficiency (chronic) (peripheral): Secondary | ICD-10-CM | POA: Diagnosis not present

## 2016-03-02 DIAGNOSIS — E11622 Type 2 diabetes mellitus with other skin ulcer: Secondary | ICD-10-CM | POA: Diagnosis not present

## 2016-03-02 DIAGNOSIS — R609 Edema, unspecified: Secondary | ICD-10-CM | POA: Diagnosis not present

## 2016-03-02 DIAGNOSIS — Z792 Long term (current) use of antibiotics: Secondary | ICD-10-CM | POA: Diagnosis not present

## 2016-03-04 ENCOUNTER — Other Ambulatory Visit: Payer: Self-pay | Admitting: "Endocrinology

## 2016-03-04 DIAGNOSIS — Z794 Long term (current) use of insulin: Secondary | ICD-10-CM | POA: Diagnosis not present

## 2016-03-04 DIAGNOSIS — N181 Chronic kidney disease, stage 1: Secondary | ICD-10-CM | POA: Diagnosis not present

## 2016-03-04 DIAGNOSIS — E1122 Type 2 diabetes mellitus with diabetic chronic kidney disease: Secondary | ICD-10-CM | POA: Diagnosis not present

## 2016-03-04 LAB — HEMOGLOBIN A1C
Hgb A1c MFr Bld: 8.7 % — ABNORMAL HIGH (ref <5.7)
Mean Plasma Glucose: 203 mg/dL

## 2016-03-04 LAB — BASIC METABOLIC PANEL
BUN: 22 mg/dL (ref 7–25)
CALCIUM: 9.5 mg/dL (ref 8.6–10.4)
CHLORIDE: 106 mmol/L (ref 98–110)
CO2: 25 mmol/L (ref 20–31)
CREATININE: 1.03 mg/dL — AB (ref 0.50–0.99)
Glucose, Bld: 194 mg/dL — ABNORMAL HIGH (ref 65–99)
Potassium: 4.2 mmol/L (ref 3.5–5.3)
Sodium: 140 mmol/L (ref 135–146)

## 2016-03-05 LAB — T4, FREE: FREE T4: 1.1 ng/dL (ref 0.8–1.8)

## 2016-03-05 LAB — TSH: TSH: 2.69 mIU/L

## 2016-03-06 ENCOUNTER — Other Ambulatory Visit: Payer: Self-pay | Admitting: "Endocrinology

## 2016-03-09 DIAGNOSIS — E11621 Type 2 diabetes mellitus with foot ulcer: Secondary | ICD-10-CM | POA: Diagnosis not present

## 2016-03-09 DIAGNOSIS — E1169 Type 2 diabetes mellitus with other specified complication: Secondary | ICD-10-CM | POA: Diagnosis not present

## 2016-03-09 DIAGNOSIS — Z794 Long term (current) use of insulin: Secondary | ICD-10-CM | POA: Diagnosis not present

## 2016-03-09 DIAGNOSIS — I519 Heart disease, unspecified: Secondary | ICD-10-CM | POA: Diagnosis not present

## 2016-03-09 DIAGNOSIS — E11622 Type 2 diabetes mellitus with other skin ulcer: Secondary | ICD-10-CM | POA: Diagnosis not present

## 2016-03-09 DIAGNOSIS — I10 Essential (primary) hypertension: Secondary | ICD-10-CM | POA: Diagnosis not present

## 2016-03-09 DIAGNOSIS — I872 Venous insufficiency (chronic) (peripheral): Secondary | ICD-10-CM | POA: Diagnosis not present

## 2016-03-09 DIAGNOSIS — R609 Edema, unspecified: Secondary | ICD-10-CM | POA: Diagnosis not present

## 2016-03-10 DIAGNOSIS — G4733 Obstructive sleep apnea (adult) (pediatric): Secondary | ICD-10-CM | POA: Diagnosis not present

## 2016-03-10 DIAGNOSIS — I482 Chronic atrial fibrillation: Secondary | ICD-10-CM | POA: Diagnosis not present

## 2016-03-10 DIAGNOSIS — E119 Type 2 diabetes mellitus without complications: Secondary | ICD-10-CM | POA: Diagnosis not present

## 2016-03-10 DIAGNOSIS — R609 Edema, unspecified: Secondary | ICD-10-CM | POA: Diagnosis not present

## 2016-03-10 DIAGNOSIS — R011 Cardiac murmur, unspecified: Secondary | ICD-10-CM | POA: Diagnosis not present

## 2016-03-10 DIAGNOSIS — I499 Cardiac arrhythmia, unspecified: Secondary | ICD-10-CM | POA: Diagnosis not present

## 2016-03-17 DIAGNOSIS — E1169 Type 2 diabetes mellitus with other specified complication: Secondary | ICD-10-CM | POA: Diagnosis not present

## 2016-03-17 DIAGNOSIS — Z794 Long term (current) use of insulin: Secondary | ICD-10-CM | POA: Diagnosis not present

## 2016-03-17 DIAGNOSIS — E11622 Type 2 diabetes mellitus with other skin ulcer: Secondary | ICD-10-CM | POA: Diagnosis not present

## 2016-03-17 DIAGNOSIS — R609 Edema, unspecified: Secondary | ICD-10-CM | POA: Diagnosis not present

## 2016-03-17 DIAGNOSIS — I10 Essential (primary) hypertension: Secondary | ICD-10-CM | POA: Diagnosis not present

## 2016-03-17 DIAGNOSIS — E11621 Type 2 diabetes mellitus with foot ulcer: Secondary | ICD-10-CM | POA: Diagnosis not present

## 2016-03-17 DIAGNOSIS — I872 Venous insufficiency (chronic) (peripheral): Secondary | ICD-10-CM | POA: Diagnosis not present

## 2016-03-17 DIAGNOSIS — I519 Heart disease, unspecified: Secondary | ICD-10-CM | POA: Diagnosis not present

## 2016-03-18 ENCOUNTER — Ambulatory Visit (INDEPENDENT_AMBULATORY_CARE_PROVIDER_SITE_OTHER): Payer: Medicare Other | Admitting: "Endocrinology

## 2016-03-18 ENCOUNTER — Encounter: Payer: Self-pay | Admitting: "Endocrinology

## 2016-03-18 VITALS — BP 163/68 | HR 97 | Ht 71.0 in | Wt 341.0 lb

## 2016-03-18 DIAGNOSIS — Z794 Long term (current) use of insulin: Secondary | ICD-10-CM | POA: Diagnosis not present

## 2016-03-18 DIAGNOSIS — Z79899 Other long term (current) drug therapy: Secondary | ICD-10-CM | POA: Diagnosis not present

## 2016-03-18 DIAGNOSIS — E119 Type 2 diabetes mellitus without complications: Secondary | ICD-10-CM | POA: Diagnosis not present

## 2016-03-18 DIAGNOSIS — Z7901 Long term (current) use of anticoagulants: Secondary | ICD-10-CM | POA: Diagnosis not present

## 2016-03-18 DIAGNOSIS — I482 Chronic atrial fibrillation: Secondary | ICD-10-CM | POA: Diagnosis not present

## 2016-03-18 DIAGNOSIS — R011 Cardiac murmur, unspecified: Secondary | ICD-10-CM | POA: Diagnosis not present

## 2016-03-18 DIAGNOSIS — N181 Chronic kidney disease, stage 1: Secondary | ICD-10-CM

## 2016-03-18 DIAGNOSIS — E1122 Type 2 diabetes mellitus with diabetic chronic kidney disease: Secondary | ICD-10-CM | POA: Diagnosis not present

## 2016-03-18 DIAGNOSIS — R609 Edema, unspecified: Secondary | ICD-10-CM | POA: Diagnosis not present

## 2016-03-18 DIAGNOSIS — R0602 Shortness of breath: Secondary | ICD-10-CM | POA: Diagnosis not present

## 2016-03-18 DIAGNOSIS — G4733 Obstructive sleep apnea (adult) (pediatric): Secondary | ICD-10-CM | POA: Diagnosis not present

## 2016-03-18 DIAGNOSIS — I10 Essential (primary) hypertension: Secondary | ICD-10-CM | POA: Diagnosis not present

## 2016-03-18 NOTE — Patient Instructions (Signed)

## 2016-03-18 NOTE — Progress Notes (Signed)
Subjective:    Patient ID: Angelica Wilson, female    DOB: 10-14-1951, PCP Josem Kaufmann, MD   Past Medical History  Diagnosis Date  . Diabetes mellitus without complication ([place])   . Hyperlipidemia   . Hypertension   . Atrial fibrillation ([place])   . Ulcer of left foot ([place])   . Hernia, umbilical   . Cardiac murmur   . Uterine cancer ([place])   . Hx of blood clots    Past Surgical History  Procedure Laterality Date  . Breast lumpectomy    . Total abdominal hysterectomy     Social History   Social History  . Marital Status: Unknown    Spouse Name: N/A  . Number of Children: N/A  . Years of Education: N/A   Social History Main Topics  . Smoking status: Never Smoker   . Smokeless tobacco: None  . Alcohol Use: No  . Drug Use: No  . Sexual Activity: Not Asked   Other Topics Concern  . None   Social History Narrative   Outpatient Encounter Prescriptions as of 03/18/2016  Medication Sig  . Alpha-Lipoic Acid 600 MG CAPS Take by mouth daily.  Marland Kitchen ALPRAZolam (XANAX) 0.5 MG tablet Take 0.5 mg by mouth 3 (three) times daily as needed for anxiety.  Marland Kitchen amLODipine (NORVASC) 5 MG tablet Take 5 mg by mouth daily.  Marland Kitchen aspirin 81 MG tablet Take 81 mg by mouth daily.  . Cholecalciferol (VITAMIN D3) 2000 UNITS TABS Take 6,000 Units by mouth daily.   Marland Kitchen glucose blood test strip Use as instructed  . HUMALOG KWIKPEN 100 UNIT/ML KiwkPen INJECT 0.15-0.21 MLS (15-21 UNITS TOTAL) INTO THE SKIN 3 (THREE) TIMES DAILY.  . hydrALAZINE (APRESOLINE) 25 MG tablet Take 25 mg by mouth 2 (two) times daily as needed.  . hydrochlorothiazide (HYDRODIURIL) 12.5 MG tablet Take 12.5 mg by mouth daily.  . Insulin Glargine (LANTUS SOLOSTAR) 100 UNIT/ML Solostar Pen Inject 70 Units into the skin at bedtime.  . insulin lispro (HUMALOG KWIKPEN) 100 UNIT/ML KiwkPen Inject 18-24 units TIDAC  . Insulin Pen Needle 31G X 4 MM MISC 1 each by Does not apply route 4 (four) times daily. Use 4 x daily  . l-methylfolate-B6-B12  (METANX) 3-35-2 MG TABS Take 1 tablet by mouth 2 (two) times daily.  . Lactobacillus-Inulin ([place]) Take by mouth daily.  Marland Kitchen LANTUS SOLOSTAR 100 UNIT/ML Solostar Pen INJECT 70 UNITS INTO THE SKIN AT BEDTIME.  . metFORMIN (GLUCOPHAGE) 500 MG tablet TAKE 1 TABLET BY MOUTH TWICE A DAY  . rivaroxaban (XARELTO) 20 MG TABS tablet Take 20 mg by mouth daily with supper.  . valsartan-hydrochlorothiazide (DIOVAN-HCT) 320-12.5 MG per tablet Take 1 tablet by mouth daily.  . vitamin C (ASCORBIC ACID) 500 MG tablet Take 500 mg by mouth 3 (three) times daily.   No facility-administered encounter medications on file as of 03/18/2016.   ALLERGIES: No Known Allergies VACCINATION STATUS: Immunization History  Administered Date(s) Administered  . Influenza-Unspecified 07/19/2014    Diabetes She presents for her follow-up diabetic visit. She has type 2 diabetes mellitus. Onset time: She was diagnosed at approximate age of 89 years. Her disease course has been improving. There are no hypoglycemic associated symptoms. Pertinent negatives for hypoglycemia include no confusion, headaches, pallor or seizures. Associated symptoms include fatigue, polydipsia and polyuria. Pertinent negatives for diabetes include no chest pain and no polyphagia. There are no hypoglycemic complications. Symptoms are improving. Diabetic complications include nephropathy and peripheral neuropathy.  Risk factors for coronary artery disease include diabetes mellitus, dyslipidemia, obesity and sedentary lifestyle. Current diabetic treatment includes intensive insulin program and oral agent (monotherapy). She is compliant with treatment some of the time. Her weight is stable. She is following a generally unhealthy diet. When asked about meal planning, she reported none. Prior visit with dietitian: She declines dietitian referral. Home blood sugar record trend: She only brought a meter showing random prayer readings averaging  greater than 200. Her meter is not properly set for making a pattern. She did not bring her log book. Her overall blood glucose range is >200 mg/dl. An ACE inhibitor/angiotensin II receptor blocker is being taken. Eye exam is current.  Hyperlipidemia This is a chronic problem. The current episode started more than 1 year ago. Pertinent negatives include no chest pain, leg pain, myalgias or shortness of breath. Risk factors for coronary artery disease include dyslipidemia, diabetes mellitus, hypertension, obesity and a sedentary lifestyle.  Hypertension This is a chronic problem. The current episode started more than 1 year ago. Pertinent negatives include no chest pain, headaches, palpitations or shortness of breath. Risk factors for coronary artery disease include dyslipidemia, diabetes mellitus, obesity and sedentary lifestyle. Past treatments include angiotensin blockers. Compliance problems include diet, exercise and psychosocial issues.  Hypertensive end-organ damage includes kidney disease.     Review of Systems  Constitutional: Positive for fatigue. Negative for unexpected weight change.  HENT: Negative for trouble swallowing and voice change.   Eyes: Negative for visual disturbance.  Respiratory: Negative for cough, shortness of breath and wheezing.   Cardiovascular: Negative for chest pain, palpitations and leg swelling.  Gastrointestinal: Negative for nausea, vomiting and diarrhea.  Endocrine: Positive for polydipsia and polyuria. Negative for cold intolerance, heat intolerance and polyphagia.  Musculoskeletal: Negative for myalgias and arthralgias.  Skin: Negative for color change, pallor, rash and wound.  Neurological: Negative for seizures and headaches.  Psychiatric/Behavioral: Negative for suicidal ideas and confusion.    Objective:    BP 163/68 mmHg  Pulse 97  Ht 5\' 11"  (1.803 m)  Wt 341 lb (154.677 kg)  BMI 47.58 kg/m2  SpO2 97%  Wt Readings from Last 3 Encounters:   03/18/16 341 lb (154.677 kg)  11/11/15 346 lb (156.945 kg)  07/18/15 330 lb (149.687 kg)    Physical Exam  Constitutional: She is oriented to person, place, and time. She appears well-developed.  HENT:  Head: Normocephalic and atraumatic.  Eyes: EOM are normal.  Neck: Normal range of motion. Neck supple. No tracheal deviation present. No thyromegaly present.  Cardiovascular: Normal rate and regular rhythm.   Pulmonary/Chest: Effort normal and breath sounds normal.  Abdominal: Soft. Bowel sounds are normal. There is no tenderness. There is no guarding.  Musculoskeletal: Normal range of motion. She exhibits no edema.       Right ankle: She exhibits swelling.       Left ankle: She exhibits swelling.       Right foot: There is swelling.       Left foot: There is swelling.  Neurological: She is alert and oriented to person, place, and time. She has normal reflexes. No cranial nerve deficit. Coordination normal.  Skin: Skin is warm and dry. No rash noted. No erythema. No pallor.  Psychiatric: She has a normal mood and affect. Judgment normal.    Results for orders placed or performed in visit on [id number]  Basic metabolic panel  Result Value Ref Range   Sodium 140 135 - 146 mmol/L  Potassium 4.2 3.5 - 5.3 mmol/L   Chloride 106 98 - 110 mmol/L   CO2 25 20 - 31 mmol/L   Glucose, Bld 194 (H) 65 - 99 mg/dL   BUN 22 7 - 25 mg/dL   Creat 1.03 (H) 0.50 - 0.99 mg/dL   Calcium 9.5 8.6 - 10.4 mg/dL  TSH  Result Value Ref Range   TSH 2.69 mIU/L  T4, free  Result Value Ref Range   Free T4 1.1 0.8 - 1.8 ng/dL  Hemoglobin A1c  Result Value Ref Range   Hgb A1c MFr Bld 8.7 (H) <5.7 %   Mean Plasma Glucose 203 mg/dL   Complete Blood Count (Most recent): No results found for: WBC, HGB, HCT, MCV, PLT Chemistry (most recent): Lab Results  Component Value Date   NA 140 03/04/2016   K 4.2 03/04/2016   CL 106 03/04/2016   CO2 25 03/04/2016   BUN 22 03/04/2016   CREATININE 1.03* 03/04/2016    Diabetic Labs (most recent): Lab Results  Component Value Date   HGBA1C 8.7* 03/04/2016   HGBA1C 9.6* 10/30/2015   HGBA1C 10.2* 07/08/2015     Assessment & Plan:   1. Type 2 diabetes mellitus with stage 1 chronic kidney disease, with long-term current use of insulin (HCC)   - Her diabetes is  complicated by stage II CK D and patient remains at a high risk for more acute and chronic complications of diabetes which include CAD, CVA, CKD, retinopathy, and neuropathy. These are all discussed in detail with the patient.  Patient came with improved but still persistently above target glucose profile, and  recent A1c  8.7%, generally improving from 10.2%.  She did not bring her insulin administration and logs to review .  Recent labs reviewed.   - I have re-counseled the patient on diet management and weight loss  by adopting a carbohydrate restricted / protein rich  Diet.  - Suggestion is made for patient to avoid simple carbohydrates   from their diet including Cakes , Desserts, Ice Cream,  Soda (  diet and regular) , Sweet Tea , Candies,  Chips, Cookies, Artificial Sweeteners,   and "Sugar-free" Products .  This will help patient to have stable blood glucose profile and potentially avoid unintended  Weight gain.  - Patient is advised to stick to a routine mealtimes to eat 3 meals  a day and avoid unnecessary snacks ( to snack only to correct hypoglycemia).  - The patient  declined referral to a CDE for individualized DM education.  - I have approached patient with the following individualized plan to manage diabetes and patient agrees.  - This patient is alarmingly noncompliant. - Without a meter nor blood glucose logs, it is difficult to adjust her therapy. I shared my concerns with her and told her that this is the last time I will see her without her meter and/or logs.  - I advised her to continue Lantus  70 units qhs, and prandial insulin Novolog  18 units TIDAC for pre-meal BG  readings of 90-150mg /dl, plus patient specific correction dose of rapid acting insulin for unexpected hyperglycemia above 150mg /dl, associated with strict monitoring of BG AC and HS.  She has to commit to strict monitoring before increasing insulin for safe use. -Adjustment parameters for hypo and hyperglycemia were given in a written document to patient. -Patient is encouraged to call clinic for blood glucose levels less than 70 or above 300 mg /dl. I advised her to continue  MTF 500mg  po BID.  - Patient specific target  for A1c; LDL, HDL, Triglycerides, and  Waist Circumference were discussed in detail.  2) BP/HTNuncontrolled. Continue current medications including ACEI/ARB. 3) Lipids/HPL:  She is not on statins, will consider next visit. 4)  Weight/Diet: CDE consult in progress, exercise, and carbohydrates information provided.  5) Personal history of noncompliance with medical treatment, presenting hazards to health -He is counseled extensively to be more engaged in the care of diabetes to prevent competitions.  6) Chronic Care/Health Maintenance:  -Patient is on ACEI/ARB  and encouraged to continue to follow up with Ophthalmology, Podiatrist at least yearly or according to recommendations, and advised to  stay away from smoking. I have recommended yearly flu vaccine and pneumonia vaccination at least every 5 years; moderate intensity exercise for up to 150 minutes weekly; and  sleep for at least 7 hours a day.  - 25 minutes of time was spent on the care of this patient , 50% of which was applied for counseling on diabetes complications and their preventions.  - I advised patient to maintain close follow up with [facility], MD for primary care needs.  Patient is asked to bring meter and  blood glucose logs during their next visit.   Follow up plan: -Return in about 3 months (around 06/17/2016) for diabetes, high blood pressure, high cholesterol, follow up with pre-visit labs, meter, and  logs.  Glade Lloyd, MD Phone: [phone]  Fax: [phone]   03/18/2016, 1:32 PM

## 2016-03-24 DIAGNOSIS — R609 Edema, unspecified: Secondary | ICD-10-CM | POA: Diagnosis not present

## 2016-03-24 DIAGNOSIS — E11622 Type 2 diabetes mellitus with other skin ulcer: Secondary | ICD-10-CM | POA: Diagnosis not present

## 2016-03-24 DIAGNOSIS — I519 Heart disease, unspecified: Secondary | ICD-10-CM | POA: Diagnosis not present

## 2016-03-24 DIAGNOSIS — I10 Essential (primary) hypertension: Secondary | ICD-10-CM | POA: Diagnosis not present

## 2016-03-24 DIAGNOSIS — Z794 Long term (current) use of insulin: Secondary | ICD-10-CM | POA: Diagnosis not present

## 2016-03-24 DIAGNOSIS — I872 Venous insufficiency (chronic) (peripheral): Secondary | ICD-10-CM | POA: Diagnosis not present

## 2016-03-24 DIAGNOSIS — E11621 Type 2 diabetes mellitus with foot ulcer: Secondary | ICD-10-CM | POA: Diagnosis not present

## 2016-03-24 DIAGNOSIS — E1169 Type 2 diabetes mellitus with other specified complication: Secondary | ICD-10-CM | POA: Diagnosis not present

## 2016-03-31 DIAGNOSIS — G629 Polyneuropathy, unspecified: Secondary | ICD-10-CM | POA: Diagnosis not present

## 2016-03-31 DIAGNOSIS — E11621 Type 2 diabetes mellitus with foot ulcer: Secondary | ICD-10-CM | POA: Diagnosis not present

## 2016-03-31 DIAGNOSIS — Z794 Long term (current) use of insulin: Secondary | ICD-10-CM | POA: Diagnosis not present

## 2016-03-31 DIAGNOSIS — I872 Venous insufficiency (chronic) (peripheral): Secondary | ICD-10-CM | POA: Diagnosis not present

## 2016-03-31 DIAGNOSIS — E11622 Type 2 diabetes mellitus with other skin ulcer: Secondary | ICD-10-CM | POA: Diagnosis not present

## 2016-03-31 DIAGNOSIS — I10 Essential (primary) hypertension: Secondary | ICD-10-CM | POA: Diagnosis not present

## 2016-03-31 DIAGNOSIS — E1169 Type 2 diabetes mellitus with other specified complication: Secondary | ICD-10-CM | POA: Diagnosis not present

## 2016-03-31 DIAGNOSIS — Z7901 Long term (current) use of anticoagulants: Secondary | ICD-10-CM | POA: Diagnosis not present

## 2016-03-31 DIAGNOSIS — Z91030 Bee allergy status: Secondary | ICD-10-CM | POA: Diagnosis not present

## 2016-03-31 DIAGNOSIS — L97521 Non-pressure chronic ulcer of other part of left foot limited to breakdown of skin: Secondary | ICD-10-CM | POA: Diagnosis not present

## 2016-03-31 DIAGNOSIS — I519 Heart disease, unspecified: Secondary | ICD-10-CM | POA: Diagnosis not present

## 2016-04-07 DIAGNOSIS — I872 Venous insufficiency (chronic) (peripheral): Secondary | ICD-10-CM | POA: Diagnosis not present

## 2016-04-07 DIAGNOSIS — E1169 Type 2 diabetes mellitus with other specified complication: Secondary | ICD-10-CM | POA: Diagnosis not present

## 2016-04-07 DIAGNOSIS — E11621 Type 2 diabetes mellitus with foot ulcer: Secondary | ICD-10-CM | POA: Diagnosis not present

## 2016-04-07 DIAGNOSIS — L97521 Non-pressure chronic ulcer of other part of left foot limited to breakdown of skin: Secondary | ICD-10-CM | POA: Diagnosis not present

## 2016-04-07 DIAGNOSIS — I10 Essential (primary) hypertension: Secondary | ICD-10-CM | POA: Diagnosis not present

## 2016-04-07 DIAGNOSIS — E11622 Type 2 diabetes mellitus with other skin ulcer: Secondary | ICD-10-CM | POA: Diagnosis not present

## 2016-04-07 DIAGNOSIS — I519 Heart disease, unspecified: Secondary | ICD-10-CM | POA: Diagnosis not present

## 2016-04-07 DIAGNOSIS — Z91030 Bee allergy status: Secondary | ICD-10-CM | POA: Diagnosis not present

## 2016-04-15 DIAGNOSIS — E11622 Type 2 diabetes mellitus with other skin ulcer: Secondary | ICD-10-CM | POA: Diagnosis not present

## 2016-04-15 DIAGNOSIS — E1169 Type 2 diabetes mellitus with other specified complication: Secondary | ICD-10-CM | POA: Diagnosis not present

## 2016-04-15 DIAGNOSIS — Z91030 Bee allergy status: Secondary | ICD-10-CM | POA: Diagnosis not present

## 2016-04-15 DIAGNOSIS — I10 Essential (primary) hypertension: Secondary | ICD-10-CM | POA: Diagnosis not present

## 2016-04-15 DIAGNOSIS — I519 Heart disease, unspecified: Secondary | ICD-10-CM | POA: Diagnosis not present

## 2016-04-15 DIAGNOSIS — L97521 Non-pressure chronic ulcer of other part of left foot limited to breakdown of skin: Secondary | ICD-10-CM | POA: Diagnosis not present

## 2016-04-15 DIAGNOSIS — E11621 Type 2 diabetes mellitus with foot ulcer: Secondary | ICD-10-CM | POA: Diagnosis not present

## 2016-04-15 DIAGNOSIS — I872 Venous insufficiency (chronic) (peripheral): Secondary | ICD-10-CM | POA: Diagnosis not present

## 2016-04-22 DIAGNOSIS — E1169 Type 2 diabetes mellitus with other specified complication: Secondary | ICD-10-CM | POA: Diagnosis not present

## 2016-04-22 DIAGNOSIS — I872 Venous insufficiency (chronic) (peripheral): Secondary | ICD-10-CM | POA: Diagnosis not present

## 2016-04-22 DIAGNOSIS — L97521 Non-pressure chronic ulcer of other part of left foot limited to breakdown of skin: Secondary | ICD-10-CM | POA: Diagnosis not present

## 2016-04-22 DIAGNOSIS — I10 Essential (primary) hypertension: Secondary | ICD-10-CM | POA: Diagnosis not present

## 2016-04-22 DIAGNOSIS — E11622 Type 2 diabetes mellitus with other skin ulcer: Secondary | ICD-10-CM | POA: Diagnosis not present

## 2016-04-22 DIAGNOSIS — E11621 Type 2 diabetes mellitus with foot ulcer: Secondary | ICD-10-CM | POA: Diagnosis not present

## 2016-04-22 DIAGNOSIS — Z91030 Bee allergy status: Secondary | ICD-10-CM | POA: Diagnosis not present

## 2016-04-22 DIAGNOSIS — I519 Heart disease, unspecified: Secondary | ICD-10-CM | POA: Diagnosis not present

## 2016-04-29 DIAGNOSIS — I519 Heart disease, unspecified: Secondary | ICD-10-CM | POA: Diagnosis not present

## 2016-04-29 DIAGNOSIS — L97521 Non-pressure chronic ulcer of other part of left foot limited to breakdown of skin: Secondary | ICD-10-CM | POA: Diagnosis not present

## 2016-04-29 DIAGNOSIS — E1169 Type 2 diabetes mellitus with other specified complication: Secondary | ICD-10-CM | POA: Diagnosis not present

## 2016-04-29 DIAGNOSIS — I872 Venous insufficiency (chronic) (peripheral): Secondary | ICD-10-CM | POA: Diagnosis not present

## 2016-04-29 DIAGNOSIS — I10 Essential (primary) hypertension: Secondary | ICD-10-CM | POA: Diagnosis not present

## 2016-04-29 DIAGNOSIS — E11621 Type 2 diabetes mellitus with foot ulcer: Secondary | ICD-10-CM | POA: Diagnosis not present

## 2016-04-29 DIAGNOSIS — Z91030 Bee allergy status: Secondary | ICD-10-CM | POA: Diagnosis not present

## 2016-04-29 DIAGNOSIS — E11622 Type 2 diabetes mellitus with other skin ulcer: Secondary | ICD-10-CM | POA: Diagnosis not present

## 2016-05-06 DIAGNOSIS — I10 Essential (primary) hypertension: Secondary | ICD-10-CM | POA: Diagnosis not present

## 2016-05-06 DIAGNOSIS — R609 Edema, unspecified: Secondary | ICD-10-CM | POA: Diagnosis not present

## 2016-05-06 DIAGNOSIS — I519 Heart disease, unspecified: Secondary | ICD-10-CM | POA: Diagnosis not present

## 2016-05-06 DIAGNOSIS — Z794 Long term (current) use of insulin: Secondary | ICD-10-CM | POA: Diagnosis not present

## 2016-05-06 DIAGNOSIS — Z7901 Long term (current) use of anticoagulants: Secondary | ICD-10-CM | POA: Diagnosis not present

## 2016-05-06 DIAGNOSIS — Z91030 Bee allergy status: Secondary | ICD-10-CM | POA: Diagnosis not present

## 2016-05-06 DIAGNOSIS — S90512A Abrasion, left ankle, initial encounter: Secondary | ICD-10-CM | POA: Diagnosis not present

## 2016-05-06 DIAGNOSIS — I872 Venous insufficiency (chronic) (peripheral): Secondary | ICD-10-CM | POA: Diagnosis not present

## 2016-05-06 DIAGNOSIS — E11622 Type 2 diabetes mellitus with other skin ulcer: Secondary | ICD-10-CM | POA: Diagnosis not present

## 2016-05-06 DIAGNOSIS — E1169 Type 2 diabetes mellitus with other specified complication: Secondary | ICD-10-CM | POA: Diagnosis not present

## 2016-05-15 DIAGNOSIS — E11622 Type 2 diabetes mellitus with other skin ulcer: Secondary | ICD-10-CM | POA: Diagnosis not present

## 2016-05-15 DIAGNOSIS — I10 Essential (primary) hypertension: Secondary | ICD-10-CM | POA: Diagnosis not present

## 2016-05-15 DIAGNOSIS — R609 Edema, unspecified: Secondary | ICD-10-CM | POA: Diagnosis not present

## 2016-05-15 DIAGNOSIS — S90512A Abrasion, left ankle, initial encounter: Secondary | ICD-10-CM | POA: Diagnosis not present

## 2016-05-15 DIAGNOSIS — I872 Venous insufficiency (chronic) (peripheral): Secondary | ICD-10-CM | POA: Diagnosis not present

## 2016-05-15 DIAGNOSIS — E1169 Type 2 diabetes mellitus with other specified complication: Secondary | ICD-10-CM | POA: Diagnosis not present

## 2016-05-20 DIAGNOSIS — R609 Edema, unspecified: Secondary | ICD-10-CM | POA: Diagnosis not present

## 2016-05-20 DIAGNOSIS — S90512A Abrasion, left ankle, initial encounter: Secondary | ICD-10-CM | POA: Diagnosis not present

## 2016-05-20 DIAGNOSIS — I10 Essential (primary) hypertension: Secondary | ICD-10-CM | POA: Diagnosis not present

## 2016-05-20 DIAGNOSIS — E11622 Type 2 diabetes mellitus with other skin ulcer: Secondary | ICD-10-CM | POA: Diagnosis not present

## 2016-05-20 DIAGNOSIS — I872 Venous insufficiency (chronic) (peripheral): Secondary | ICD-10-CM | POA: Diagnosis not present

## 2016-05-20 DIAGNOSIS — E1169 Type 2 diabetes mellitus with other specified complication: Secondary | ICD-10-CM | POA: Diagnosis not present

## 2016-05-20 DIAGNOSIS — R600 Localized edema: Secondary | ICD-10-CM | POA: Diagnosis not present

## 2016-05-28 DIAGNOSIS — R609 Edema, unspecified: Secondary | ICD-10-CM | POA: Diagnosis not present

## 2016-05-28 DIAGNOSIS — E1169 Type 2 diabetes mellitus with other specified complication: Secondary | ICD-10-CM | POA: Diagnosis not present

## 2016-05-28 DIAGNOSIS — S90512A Abrasion, left ankle, initial encounter: Secondary | ICD-10-CM | POA: Diagnosis not present

## 2016-05-28 DIAGNOSIS — I10 Essential (primary) hypertension: Secondary | ICD-10-CM | POA: Diagnosis not present

## 2016-05-28 DIAGNOSIS — I872 Venous insufficiency (chronic) (peripheral): Secondary | ICD-10-CM | POA: Diagnosis not present

## 2016-05-28 DIAGNOSIS — E11622 Type 2 diabetes mellitus with other skin ulcer: Secondary | ICD-10-CM | POA: Diagnosis not present

## 2016-06-05 DIAGNOSIS — I872 Venous insufficiency (chronic) (peripheral): Secondary | ICD-10-CM | POA: Diagnosis not present

## 2016-06-05 DIAGNOSIS — Z91030 Bee allergy status: Secondary | ICD-10-CM | POA: Diagnosis not present

## 2016-06-05 DIAGNOSIS — L03116 Cellulitis of left lower limb: Secondary | ICD-10-CM | POA: Diagnosis not present

## 2016-06-05 DIAGNOSIS — I509 Heart failure, unspecified: Secondary | ICD-10-CM | POA: Diagnosis not present

## 2016-06-05 DIAGNOSIS — L600 Ingrowing nail: Secondary | ICD-10-CM | POA: Diagnosis not present

## 2016-06-05 DIAGNOSIS — L97521 Non-pressure chronic ulcer of other part of left foot limited to breakdown of skin: Secondary | ICD-10-CM | POA: Diagnosis not present

## 2016-06-05 DIAGNOSIS — Z794 Long term (current) use of insulin: Secondary | ICD-10-CM | POA: Diagnosis not present

## 2016-06-05 DIAGNOSIS — Z7901 Long term (current) use of anticoagulants: Secondary | ICD-10-CM | POA: Diagnosis not present

## 2016-06-05 DIAGNOSIS — Z792 Long term (current) use of antibiotics: Secondary | ICD-10-CM | POA: Diagnosis not present

## 2016-06-05 DIAGNOSIS — E11621 Type 2 diabetes mellitus with foot ulcer: Secondary | ICD-10-CM | POA: Diagnosis not present

## 2016-06-05 DIAGNOSIS — L97909 Non-pressure chronic ulcer of unspecified part of unspecified lower leg with unspecified severity: Secondary | ICD-10-CM | POA: Diagnosis not present

## 2016-06-05 DIAGNOSIS — I10 Essential (primary) hypertension: Secondary | ICD-10-CM | POA: Diagnosis not present

## 2016-06-05 DIAGNOSIS — R600 Localized edema: Secondary | ICD-10-CM | POA: Diagnosis not present

## 2016-06-05 DIAGNOSIS — E1169 Type 2 diabetes mellitus with other specified complication: Secondary | ICD-10-CM | POA: Diagnosis not present

## 2016-06-05 DIAGNOSIS — E669 Obesity, unspecified: Secondary | ICD-10-CM | POA: Diagnosis not present

## 2016-06-09 DIAGNOSIS — Z86718 Personal history of other venous thrombosis and embolism: Secondary | ICD-10-CM | POA: Diagnosis not present

## 2016-06-09 DIAGNOSIS — E119 Type 2 diabetes mellitus without complications: Secondary | ICD-10-CM | POA: Diagnosis not present

## 2016-06-09 DIAGNOSIS — F419 Anxiety disorder, unspecified: Secondary | ICD-10-CM | POA: Diagnosis not present

## 2016-06-09 DIAGNOSIS — R609 Edema, unspecified: Secondary | ICD-10-CM | POA: Diagnosis not present

## 2016-06-09 DIAGNOSIS — I10 Essential (primary) hypertension: Secondary | ICD-10-CM | POA: Diagnosis not present

## 2016-06-09 DIAGNOSIS — L03039 Cellulitis of unspecified toe: Secondary | ICD-10-CM | POA: Diagnosis not present

## 2016-06-12 DIAGNOSIS — I10 Essential (primary) hypertension: Secondary | ICD-10-CM | POA: Diagnosis not present

## 2016-06-12 DIAGNOSIS — I872 Venous insufficiency (chronic) (peripheral): Secondary | ICD-10-CM | POA: Diagnosis not present

## 2016-06-12 DIAGNOSIS — L97909 Non-pressure chronic ulcer of unspecified part of unspecified lower leg with unspecified severity: Secondary | ICD-10-CM | POA: Diagnosis not present

## 2016-06-12 DIAGNOSIS — E11621 Type 2 diabetes mellitus with foot ulcer: Secondary | ICD-10-CM | POA: Diagnosis not present

## 2016-06-12 DIAGNOSIS — M2062 Acquired deformities of toe(s), unspecified, left foot: Secondary | ICD-10-CM | POA: Diagnosis not present

## 2016-06-12 DIAGNOSIS — L97521 Non-pressure chronic ulcer of other part of left foot limited to breakdown of skin: Secondary | ICD-10-CM | POA: Diagnosis not present

## 2016-06-12 DIAGNOSIS — L03116 Cellulitis of left lower limb: Secondary | ICD-10-CM | POA: Diagnosis not present

## 2016-06-12 DIAGNOSIS — E1169 Type 2 diabetes mellitus with other specified complication: Secondary | ICD-10-CM | POA: Diagnosis not present

## 2016-06-12 DIAGNOSIS — L600 Ingrowing nail: Secondary | ICD-10-CM | POA: Diagnosis not present

## 2016-06-15 DIAGNOSIS — E11621 Type 2 diabetes mellitus with foot ulcer: Secondary | ICD-10-CM | POA: Diagnosis not present

## 2016-06-15 DIAGNOSIS — L97521 Non-pressure chronic ulcer of other part of left foot limited to breakdown of skin: Secondary | ICD-10-CM | POA: Diagnosis not present

## 2016-06-15 DIAGNOSIS — I10 Essential (primary) hypertension: Secondary | ICD-10-CM | POA: Diagnosis not present

## 2016-06-15 DIAGNOSIS — I872 Venous insufficiency (chronic) (peripheral): Secondary | ICD-10-CM | POA: Diagnosis not present

## 2016-06-15 DIAGNOSIS — M2062 Acquired deformities of toe(s), unspecified, left foot: Secondary | ICD-10-CM | POA: Diagnosis not present

## 2016-06-15 DIAGNOSIS — L600 Ingrowing nail: Secondary | ICD-10-CM | POA: Diagnosis not present

## 2016-06-15 DIAGNOSIS — L97909 Non-pressure chronic ulcer of unspecified part of unspecified lower leg with unspecified severity: Secondary | ICD-10-CM | POA: Diagnosis not present

## 2016-06-15 DIAGNOSIS — E1169 Type 2 diabetes mellitus with other specified complication: Secondary | ICD-10-CM | POA: Diagnosis not present

## 2016-06-15 DIAGNOSIS — L03116 Cellulitis of left lower limb: Secondary | ICD-10-CM | POA: Diagnosis not present

## 2016-06-17 DIAGNOSIS — E11621 Type 2 diabetes mellitus with foot ulcer: Secondary | ICD-10-CM | POA: Diagnosis not present

## 2016-06-17 DIAGNOSIS — I872 Venous insufficiency (chronic) (peripheral): Secondary | ICD-10-CM | POA: Diagnosis not present

## 2016-06-17 DIAGNOSIS — L97521 Non-pressure chronic ulcer of other part of left foot limited to breakdown of skin: Secondary | ICD-10-CM | POA: Diagnosis not present

## 2016-06-17 DIAGNOSIS — I10 Essential (primary) hypertension: Secondary | ICD-10-CM | POA: Diagnosis not present

## 2016-06-17 DIAGNOSIS — L600 Ingrowing nail: Secondary | ICD-10-CM | POA: Diagnosis not present

## 2016-06-17 DIAGNOSIS — L03116 Cellulitis of left lower limb: Secondary | ICD-10-CM | POA: Diagnosis not present

## 2016-06-19 DIAGNOSIS — E1169 Type 2 diabetes mellitus with other specified complication: Secondary | ICD-10-CM | POA: Diagnosis not present

## 2016-06-19 DIAGNOSIS — L97521 Non-pressure chronic ulcer of other part of left foot limited to breakdown of skin: Secondary | ICD-10-CM | POA: Diagnosis not present

## 2016-06-19 DIAGNOSIS — E11621 Type 2 diabetes mellitus with foot ulcer: Secondary | ICD-10-CM | POA: Diagnosis not present

## 2016-06-19 DIAGNOSIS — M2062 Acquired deformities of toe(s), unspecified, left foot: Secondary | ICD-10-CM | POA: Diagnosis not present

## 2016-06-19 DIAGNOSIS — L97909 Non-pressure chronic ulcer of unspecified part of unspecified lower leg with unspecified severity: Secondary | ICD-10-CM | POA: Diagnosis not present

## 2016-06-19 DIAGNOSIS — I872 Venous insufficiency (chronic) (peripheral): Secondary | ICD-10-CM | POA: Diagnosis not present

## 2016-06-19 DIAGNOSIS — L03116 Cellulitis of left lower limb: Secondary | ICD-10-CM | POA: Diagnosis not present

## 2016-06-19 DIAGNOSIS — I10 Essential (primary) hypertension: Secondary | ICD-10-CM | POA: Diagnosis not present

## 2016-06-19 DIAGNOSIS — L600 Ingrowing nail: Secondary | ICD-10-CM | POA: Diagnosis not present

## 2016-06-22 ENCOUNTER — Ambulatory Visit: Payer: Medicare Other | Admitting: "Endocrinology

## 2016-06-22 DIAGNOSIS — I872 Venous insufficiency (chronic) (peripheral): Secondary | ICD-10-CM | POA: Diagnosis not present

## 2016-06-22 DIAGNOSIS — Z8542 Personal history of malignant neoplasm of other parts of uterus: Secondary | ICD-10-CM | POA: Diagnosis not present

## 2016-06-22 DIAGNOSIS — L03116 Cellulitis of left lower limb: Secondary | ICD-10-CM | POA: Diagnosis not present

## 2016-06-22 DIAGNOSIS — Z7901 Long term (current) use of anticoagulants: Secondary | ICD-10-CM | POA: Diagnosis not present

## 2016-06-22 DIAGNOSIS — Z6841 Body Mass Index (BMI) 40.0 and over, adult: Secondary | ICD-10-CM | POA: Diagnosis not present

## 2016-06-22 DIAGNOSIS — E119 Type 2 diabetes mellitus without complications: Secondary | ICD-10-CM | POA: Diagnosis not present

## 2016-06-22 DIAGNOSIS — I482 Chronic atrial fibrillation: Secondary | ICD-10-CM | POA: Diagnosis not present

## 2016-06-22 DIAGNOSIS — Z7984 Long term (current) use of oral hypoglycemic drugs: Secondary | ICD-10-CM | POA: Diagnosis not present

## 2016-06-22 DIAGNOSIS — N179 Acute kidney failure, unspecified: Secondary | ICD-10-CM | POA: Diagnosis not present

## 2016-06-22 DIAGNOSIS — Z794 Long term (current) use of insulin: Secondary | ICD-10-CM | POA: Diagnosis not present

## 2016-06-23 DIAGNOSIS — Z794 Long term (current) use of insulin: Secondary | ICD-10-CM | POA: Diagnosis not present

## 2016-06-23 DIAGNOSIS — Z8614 Personal history of Methicillin resistant Staphylococcus aureus infection: Secondary | ICD-10-CM | POA: Diagnosis not present

## 2016-06-23 DIAGNOSIS — R262 Difficulty in walking, not elsewhere classified: Secondary | ICD-10-CM | POA: Diagnosis not present

## 2016-06-23 DIAGNOSIS — Z7901 Long term (current) use of anticoagulants: Secondary | ICD-10-CM | POA: Diagnosis not present

## 2016-06-23 DIAGNOSIS — I10 Essential (primary) hypertension: Secondary | ICD-10-CM | POA: Diagnosis present

## 2016-06-23 DIAGNOSIS — Z9119 Patient's noncompliance with other medical treatment and regimen: Secondary | ICD-10-CM | POA: Diagnosis not present

## 2016-06-23 DIAGNOSIS — Z86718 Personal history of other venous thrombosis and embolism: Secondary | ICD-10-CM | POA: Diagnosis not present

## 2016-06-23 DIAGNOSIS — R791 Abnormal coagulation profile: Secondary | ICD-10-CM | POA: Diagnosis present

## 2016-06-23 DIAGNOSIS — G4733 Obstructive sleep apnea (adult) (pediatric): Secondary | ICD-10-CM | POA: Diagnosis not present

## 2016-06-23 DIAGNOSIS — N179 Acute kidney failure, unspecified: Secondary | ICD-10-CM | POA: Diagnosis not present

## 2016-06-23 DIAGNOSIS — Z79899 Other long term (current) drug therapy: Secondary | ICD-10-CM | POA: Diagnosis not present

## 2016-06-23 DIAGNOSIS — L03116 Cellulitis of left lower limb: Secondary | ICD-10-CM | POA: Diagnosis not present

## 2016-06-23 DIAGNOSIS — I482 Chronic atrial fibrillation: Secondary | ICD-10-CM | POA: Diagnosis present

## 2016-06-23 DIAGNOSIS — Z86711 Personal history of pulmonary embolism: Secondary | ICD-10-CM | POA: Diagnosis not present

## 2016-06-23 DIAGNOSIS — I872 Venous insufficiency (chronic) (peripheral): Secondary | ICD-10-CM | POA: Diagnosis present

## 2016-06-23 DIAGNOSIS — F329 Major depressive disorder, single episode, unspecified: Secondary | ICD-10-CM | POA: Diagnosis present

## 2016-06-23 DIAGNOSIS — M79672 Pain in left foot: Secondary | ICD-10-CM | POA: Diagnosis not present

## 2016-06-23 DIAGNOSIS — E119 Type 2 diabetes mellitus without complications: Secondary | ICD-10-CM | POA: Diagnosis present

## 2016-06-23 DIAGNOSIS — Z91030 Bee allergy status: Secondary | ICD-10-CM | POA: Diagnosis not present

## 2016-06-23 DIAGNOSIS — J449 Chronic obstructive pulmonary disease, unspecified: Secondary | ICD-10-CM | POA: Diagnosis not present

## 2016-06-23 DIAGNOSIS — Z6841 Body Mass Index (BMI) 40.0 and over, adult: Secondary | ICD-10-CM | POA: Diagnosis not present

## 2016-06-23 DIAGNOSIS — Z888 Allergy status to other drugs, medicaments and biological substances status: Secondary | ICD-10-CM | POA: Diagnosis not present

## 2016-06-24 DIAGNOSIS — I482 Chronic atrial fibrillation: Secondary | ICD-10-CM | POA: Diagnosis not present

## 2016-06-24 DIAGNOSIS — Z7901 Long term (current) use of anticoagulants: Secondary | ICD-10-CM | POA: Diagnosis not present

## 2016-06-24 DIAGNOSIS — E119 Type 2 diabetes mellitus without complications: Secondary | ICD-10-CM | POA: Diagnosis not present

## 2016-06-24 DIAGNOSIS — Z6841 Body Mass Index (BMI) 40.0 and over, adult: Secondary | ICD-10-CM | POA: Diagnosis not present

## 2016-06-24 DIAGNOSIS — N179 Acute kidney failure, unspecified: Secondary | ICD-10-CM | POA: Diagnosis not present

## 2016-06-24 DIAGNOSIS — L03116 Cellulitis of left lower limb: Secondary | ICD-10-CM | POA: Diagnosis not present

## 2016-06-29 ENCOUNTER — Encounter: Payer: Self-pay | Admitting: "Endocrinology

## 2016-06-29 DIAGNOSIS — L97521 Non-pressure chronic ulcer of other part of left foot limited to breakdown of skin: Secondary | ICD-10-CM | POA: Diagnosis not present

## 2016-06-29 DIAGNOSIS — I872 Venous insufficiency (chronic) (peripheral): Secondary | ICD-10-CM | POA: Diagnosis not present

## 2016-06-29 DIAGNOSIS — L600 Ingrowing nail: Secondary | ICD-10-CM | POA: Diagnosis not present

## 2016-06-29 DIAGNOSIS — E1169 Type 2 diabetes mellitus with other specified complication: Secondary | ICD-10-CM | POA: Diagnosis not present

## 2016-06-29 DIAGNOSIS — L97929 Non-pressure chronic ulcer of unspecified part of left lower leg with unspecified severity: Secondary | ICD-10-CM | POA: Diagnosis not present

## 2016-06-29 DIAGNOSIS — I10 Essential (primary) hypertension: Secondary | ICD-10-CM | POA: Diagnosis not present

## 2016-06-29 DIAGNOSIS — E11621 Type 2 diabetes mellitus with foot ulcer: Secondary | ICD-10-CM | POA: Diagnosis not present

## 2016-06-29 DIAGNOSIS — L03116 Cellulitis of left lower limb: Secondary | ICD-10-CM | POA: Diagnosis not present

## 2016-07-02 DIAGNOSIS — I10 Essential (primary) hypertension: Secondary | ICD-10-CM | POA: Diagnosis not present

## 2016-07-02 DIAGNOSIS — E119 Type 2 diabetes mellitus without complications: Secondary | ICD-10-CM | POA: Diagnosis not present

## 2016-07-02 DIAGNOSIS — Z09 Encounter for follow-up examination after completed treatment for conditions other than malignant neoplasm: Secondary | ICD-10-CM | POA: Diagnosis not present

## 2016-07-02 DIAGNOSIS — L97529 Non-pressure chronic ulcer of other part of left foot with unspecified severity: Secondary | ICD-10-CM | POA: Diagnosis not present

## 2016-07-06 ENCOUNTER — Ambulatory Visit: Payer: Medicare Other | Admitting: "Endocrinology

## 2016-07-06 ENCOUNTER — Other Ambulatory Visit: Payer: Self-pay | Admitting: "Endocrinology

## 2016-07-06 DIAGNOSIS — I878 Other specified disorders of veins: Secondary | ICD-10-CM | POA: Diagnosis not present

## 2016-07-06 DIAGNOSIS — E11622 Type 2 diabetes mellitus with other skin ulcer: Secondary | ICD-10-CM | POA: Diagnosis not present

## 2016-07-06 DIAGNOSIS — I872 Venous insufficiency (chronic) (peripheral): Secondary | ICD-10-CM | POA: Diagnosis not present

## 2016-07-06 DIAGNOSIS — L97321 Non-pressure chronic ulcer of left ankle limited to breakdown of skin: Secondary | ICD-10-CM | POA: Diagnosis not present

## 2016-07-06 DIAGNOSIS — M869 Osteomyelitis, unspecified: Secondary | ICD-10-CM | POA: Diagnosis not present

## 2016-07-06 DIAGNOSIS — L03116 Cellulitis of left lower limb: Secondary | ICD-10-CM | POA: Diagnosis not present

## 2016-07-06 DIAGNOSIS — Z792 Long term (current) use of antibiotics: Secondary | ICD-10-CM | POA: Diagnosis not present

## 2016-07-08 DIAGNOSIS — B351 Tinea unguium: Secondary | ICD-10-CM | POA: Diagnosis not present

## 2016-07-08 DIAGNOSIS — E1142 Type 2 diabetes mellitus with diabetic polyneuropathy: Secondary | ICD-10-CM | POA: Diagnosis not present

## 2016-07-09 DIAGNOSIS — L0390 Cellulitis, unspecified: Secondary | ICD-10-CM | POA: Diagnosis not present

## 2016-07-09 DIAGNOSIS — Z8739 Personal history of other diseases of the musculoskeletal system and connective tissue: Secondary | ICD-10-CM | POA: Diagnosis not present

## 2016-07-09 DIAGNOSIS — L97529 Non-pressure chronic ulcer of other part of left foot with unspecified severity: Secondary | ICD-10-CM | POA: Diagnosis not present

## 2016-07-09 DIAGNOSIS — L97421 Non-pressure chronic ulcer of left heel and midfoot limited to breakdown of skin: Secondary | ICD-10-CM | POA: Diagnosis not present

## 2016-07-10 DIAGNOSIS — Z792 Long term (current) use of antibiotics: Secondary | ICD-10-CM | POA: Diagnosis not present

## 2016-07-10 DIAGNOSIS — I878 Other specified disorders of veins: Secondary | ICD-10-CM | POA: Diagnosis not present

## 2016-07-10 DIAGNOSIS — L97321 Non-pressure chronic ulcer of left ankle limited to breakdown of skin: Secondary | ICD-10-CM | POA: Diagnosis not present

## 2016-07-10 DIAGNOSIS — I872 Venous insufficiency (chronic) (peripheral): Secondary | ICD-10-CM | POA: Diagnosis not present

## 2016-07-10 DIAGNOSIS — L03116 Cellulitis of left lower limb: Secondary | ICD-10-CM | POA: Diagnosis not present

## 2016-07-10 DIAGNOSIS — E11622 Type 2 diabetes mellitus with other skin ulcer: Secondary | ICD-10-CM | POA: Diagnosis not present

## 2016-07-14 DIAGNOSIS — E11622 Type 2 diabetes mellitus with other skin ulcer: Secondary | ICD-10-CM | POA: Diagnosis not present

## 2016-07-14 DIAGNOSIS — I872 Venous insufficiency (chronic) (peripheral): Secondary | ICD-10-CM | POA: Diagnosis not present

## 2016-07-14 DIAGNOSIS — L97521 Non-pressure chronic ulcer of other part of left foot limited to breakdown of skin: Secondary | ICD-10-CM | POA: Diagnosis not present

## 2016-07-14 DIAGNOSIS — Z792 Long term (current) use of antibiotics: Secondary | ICD-10-CM | POA: Diagnosis not present

## 2016-07-14 DIAGNOSIS — L97321 Non-pressure chronic ulcer of left ankle limited to breakdown of skin: Secondary | ICD-10-CM | POA: Diagnosis not present

## 2016-07-14 DIAGNOSIS — I878 Other specified disorders of veins: Secondary | ICD-10-CM | POA: Diagnosis not present

## 2016-07-14 DIAGNOSIS — L03116 Cellulitis of left lower limb: Secondary | ICD-10-CM | POA: Diagnosis not present

## 2016-07-21 DIAGNOSIS — E1169 Type 2 diabetes mellitus with other specified complication: Secondary | ICD-10-CM | POA: Diagnosis not present

## 2016-07-21 DIAGNOSIS — L97522 Non-pressure chronic ulcer of other part of left foot with fat layer exposed: Secondary | ICD-10-CM | POA: Diagnosis not present

## 2016-07-21 DIAGNOSIS — E11622 Type 2 diabetes mellitus with other skin ulcer: Secondary | ICD-10-CM | POA: Diagnosis not present

## 2016-07-21 DIAGNOSIS — I878 Other specified disorders of veins: Secondary | ICD-10-CM | POA: Diagnosis not present

## 2016-07-21 DIAGNOSIS — I872 Venous insufficiency (chronic) (peripheral): Secondary | ICD-10-CM | POA: Diagnosis not present

## 2016-07-21 DIAGNOSIS — Z792 Long term (current) use of antibiotics: Secondary | ICD-10-CM | POA: Diagnosis not present

## 2016-07-21 DIAGNOSIS — L03116 Cellulitis of left lower limb: Secondary | ICD-10-CM | POA: Diagnosis not present

## 2016-07-21 DIAGNOSIS — L97322 Non-pressure chronic ulcer of left ankle with fat layer exposed: Secondary | ICD-10-CM | POA: Diagnosis not present

## 2016-07-21 DIAGNOSIS — L97321 Non-pressure chronic ulcer of left ankle limited to breakdown of skin: Secondary | ICD-10-CM | POA: Diagnosis not present

## 2016-07-29 DIAGNOSIS — E1169 Type 2 diabetes mellitus with other specified complication: Secondary | ICD-10-CM | POA: Diagnosis not present

## 2016-07-29 DIAGNOSIS — L03116 Cellulitis of left lower limb: Secondary | ICD-10-CM | POA: Diagnosis not present

## 2016-07-29 DIAGNOSIS — I872 Venous insufficiency (chronic) (peripheral): Secondary | ICD-10-CM | POA: Diagnosis not present

## 2016-07-29 DIAGNOSIS — E11622 Type 2 diabetes mellitus with other skin ulcer: Secondary | ICD-10-CM | POA: Diagnosis not present

## 2016-07-29 DIAGNOSIS — L97321 Non-pressure chronic ulcer of left ankle limited to breakdown of skin: Secondary | ICD-10-CM | POA: Diagnosis not present

## 2016-07-29 DIAGNOSIS — I878 Other specified disorders of veins: Secondary | ICD-10-CM | POA: Diagnosis not present

## 2016-07-29 DIAGNOSIS — Z792 Long term (current) use of antibiotics: Secondary | ICD-10-CM | POA: Diagnosis not present

## 2016-08-05 DIAGNOSIS — M869 Osteomyelitis, unspecified: Secondary | ICD-10-CM | POA: Diagnosis not present

## 2016-08-06 DIAGNOSIS — L97322 Non-pressure chronic ulcer of left ankle with fat layer exposed: Secondary | ICD-10-CM | POA: Diagnosis not present

## 2016-08-06 DIAGNOSIS — I872 Venous insufficiency (chronic) (peripheral): Secondary | ICD-10-CM | POA: Diagnosis not present

## 2016-08-06 DIAGNOSIS — E1169 Type 2 diabetes mellitus with other specified complication: Secondary | ICD-10-CM | POA: Diagnosis not present

## 2016-08-06 DIAGNOSIS — I878 Other specified disorders of veins: Secondary | ICD-10-CM | POA: Diagnosis not present

## 2016-08-06 DIAGNOSIS — L97521 Non-pressure chronic ulcer of other part of left foot limited to breakdown of skin: Secondary | ICD-10-CM | POA: Diagnosis not present

## 2016-08-06 DIAGNOSIS — E11621 Type 2 diabetes mellitus with foot ulcer: Secondary | ICD-10-CM | POA: Diagnosis not present

## 2016-08-06 DIAGNOSIS — L97522 Non-pressure chronic ulcer of other part of left foot with fat layer exposed: Secondary | ICD-10-CM | POA: Diagnosis not present

## 2016-08-06 DIAGNOSIS — L03116 Cellulitis of left lower limb: Secondary | ICD-10-CM | POA: Diagnosis not present

## 2016-08-10 ENCOUNTER — Ambulatory Visit: Payer: Medicare Other | Admitting: "Endocrinology

## 2016-08-10 DIAGNOSIS — E1169 Type 2 diabetes mellitus with other specified complication: Secondary | ICD-10-CM | POA: Diagnosis not present

## 2016-08-10 DIAGNOSIS — L03116 Cellulitis of left lower limb: Secondary | ICD-10-CM | POA: Diagnosis not present

## 2016-08-10 DIAGNOSIS — I878 Other specified disorders of veins: Secondary | ICD-10-CM | POA: Diagnosis not present

## 2016-08-10 DIAGNOSIS — I872 Venous insufficiency (chronic) (peripheral): Secondary | ICD-10-CM | POA: Diagnosis not present

## 2016-08-10 DIAGNOSIS — L97521 Non-pressure chronic ulcer of other part of left foot limited to breakdown of skin: Secondary | ICD-10-CM | POA: Diagnosis not present

## 2016-08-10 DIAGNOSIS — E11621 Type 2 diabetes mellitus with foot ulcer: Secondary | ICD-10-CM | POA: Diagnosis not present

## 2016-08-17 DIAGNOSIS — I878 Other specified disorders of veins: Secondary | ICD-10-CM | POA: Diagnosis not present

## 2016-08-17 DIAGNOSIS — E1169 Type 2 diabetes mellitus with other specified complication: Secondary | ICD-10-CM | POA: Diagnosis not present

## 2016-08-17 DIAGNOSIS — L97521 Non-pressure chronic ulcer of other part of left foot limited to breakdown of skin: Secondary | ICD-10-CM | POA: Diagnosis not present

## 2016-08-17 DIAGNOSIS — L03116 Cellulitis of left lower limb: Secondary | ICD-10-CM | POA: Diagnosis not present

## 2016-08-17 DIAGNOSIS — E11621 Type 2 diabetes mellitus with foot ulcer: Secondary | ICD-10-CM | POA: Diagnosis not present

## 2016-08-17 DIAGNOSIS — I872 Venous insufficiency (chronic) (peripheral): Secondary | ICD-10-CM | POA: Diagnosis not present

## 2016-08-24 DIAGNOSIS — L03116 Cellulitis of left lower limb: Secondary | ICD-10-CM | POA: Diagnosis not present

## 2016-08-24 DIAGNOSIS — I878 Other specified disorders of veins: Secondary | ICD-10-CM | POA: Diagnosis not present

## 2016-08-24 DIAGNOSIS — L97521 Non-pressure chronic ulcer of other part of left foot limited to breakdown of skin: Secondary | ICD-10-CM | POA: Diagnosis not present

## 2016-08-24 DIAGNOSIS — E11621 Type 2 diabetes mellitus with foot ulcer: Secondary | ICD-10-CM | POA: Diagnosis not present

## 2016-08-31 DIAGNOSIS — I878 Other specified disorders of veins: Secondary | ICD-10-CM | POA: Diagnosis not present

## 2016-08-31 DIAGNOSIS — M1990 Unspecified osteoarthritis, unspecified site: Secondary | ICD-10-CM | POA: Diagnosis not present

## 2016-08-31 DIAGNOSIS — L539 Erythematous condition, unspecified: Secondary | ICD-10-CM | POA: Diagnosis not present

## 2016-09-07 DIAGNOSIS — E119 Type 2 diabetes mellitus without complications: Secondary | ICD-10-CM | POA: Diagnosis not present

## 2016-09-07 DIAGNOSIS — M1990 Unspecified osteoarthritis, unspecified site: Secondary | ICD-10-CM | POA: Diagnosis not present

## 2016-09-07 DIAGNOSIS — L539 Erythematous condition, unspecified: Secondary | ICD-10-CM | POA: Diagnosis not present

## 2016-09-07 DIAGNOSIS — I878 Other specified disorders of veins: Secondary | ICD-10-CM | POA: Diagnosis not present

## 2016-09-07 DIAGNOSIS — I872 Venous insufficiency (chronic) (peripheral): Secondary | ICD-10-CM | POA: Diagnosis not present

## 2016-09-08 DIAGNOSIS — L539 Erythematous condition, unspecified: Secondary | ICD-10-CM | POA: Diagnosis not present

## 2016-09-08 DIAGNOSIS — L0390 Cellulitis, unspecified: Secondary | ICD-10-CM | POA: Diagnosis not present

## 2016-09-08 DIAGNOSIS — M1990 Unspecified osteoarthritis, unspecified site: Secondary | ICD-10-CM | POA: Diagnosis not present

## 2016-09-08 DIAGNOSIS — I878 Other specified disorders of veins: Secondary | ICD-10-CM | POA: Diagnosis not present

## 2016-09-08 DIAGNOSIS — L97509 Non-pressure chronic ulcer of other part of unspecified foot with unspecified severity: Secondary | ICD-10-CM | POA: Diagnosis not present

## 2016-09-10 DIAGNOSIS — E119 Type 2 diabetes mellitus without complications: Secondary | ICD-10-CM | POA: Diagnosis not present

## 2016-09-10 DIAGNOSIS — I872 Venous insufficiency (chronic) (peripheral): Secondary | ICD-10-CM | POA: Diagnosis not present

## 2016-09-10 DIAGNOSIS — I878 Other specified disorders of veins: Secondary | ICD-10-CM | POA: Diagnosis not present

## 2016-09-10 DIAGNOSIS — M1990 Unspecified osteoarthritis, unspecified site: Secondary | ICD-10-CM | POA: Diagnosis not present

## 2016-09-10 DIAGNOSIS — L539 Erythematous condition, unspecified: Secondary | ICD-10-CM | POA: Diagnosis not present

## 2016-09-16 DIAGNOSIS — M1990 Unspecified osteoarthritis, unspecified site: Secondary | ICD-10-CM | POA: Diagnosis not present

## 2016-09-16 DIAGNOSIS — L539 Erythematous condition, unspecified: Secondary | ICD-10-CM | POA: Diagnosis not present

## 2016-09-16 DIAGNOSIS — I872 Venous insufficiency (chronic) (peripheral): Secondary | ICD-10-CM | POA: Diagnosis not present

## 2016-09-16 DIAGNOSIS — E11621 Type 2 diabetes mellitus with foot ulcer: Secondary | ICD-10-CM | POA: Diagnosis not present

## 2016-09-16 DIAGNOSIS — I878 Other specified disorders of veins: Secondary | ICD-10-CM | POA: Diagnosis not present

## 2016-09-16 DIAGNOSIS — L97521 Non-pressure chronic ulcer of other part of left foot limited to breakdown of skin: Secondary | ICD-10-CM | POA: Diagnosis not present

## 2016-09-16 DIAGNOSIS — L97222 Non-pressure chronic ulcer of left calf with fat layer exposed: Secondary | ICD-10-CM | POA: Diagnosis not present

## 2016-09-23 DIAGNOSIS — M1990 Unspecified osteoarthritis, unspecified site: Secondary | ICD-10-CM | POA: Diagnosis not present

## 2016-09-23 DIAGNOSIS — L539 Erythematous condition, unspecified: Secondary | ICD-10-CM | POA: Diagnosis not present

## 2016-09-23 DIAGNOSIS — E119 Type 2 diabetes mellitus without complications: Secondary | ICD-10-CM | POA: Diagnosis not present

## 2016-09-23 DIAGNOSIS — I878 Other specified disorders of veins: Secondary | ICD-10-CM | POA: Diagnosis not present

## 2016-09-23 DIAGNOSIS — I872 Venous insufficiency (chronic) (peripheral): Secondary | ICD-10-CM | POA: Diagnosis not present

## 2016-09-30 DIAGNOSIS — E11622 Type 2 diabetes mellitus with other skin ulcer: Secondary | ICD-10-CM | POA: Diagnosis not present

## 2016-09-30 DIAGNOSIS — L97928 Non-pressure chronic ulcer of unspecified part of left lower leg with other specified severity: Secondary | ICD-10-CM | POA: Diagnosis not present

## 2016-09-30 DIAGNOSIS — E119 Type 2 diabetes mellitus without complications: Secondary | ICD-10-CM | POA: Diagnosis not present

## 2016-09-30 DIAGNOSIS — I872 Venous insufficiency (chronic) (peripheral): Secondary | ICD-10-CM | POA: Diagnosis not present

## 2016-10-09 DIAGNOSIS — L97928 Non-pressure chronic ulcer of unspecified part of left lower leg with other specified severity: Secondary | ICD-10-CM | POA: Diagnosis not present

## 2016-10-09 DIAGNOSIS — E11622 Type 2 diabetes mellitus with other skin ulcer: Secondary | ICD-10-CM | POA: Diagnosis not present

## 2016-10-09 DIAGNOSIS — I872 Venous insufficiency (chronic) (peripheral): Secondary | ICD-10-CM | POA: Diagnosis not present

## 2016-10-09 DIAGNOSIS — E119 Type 2 diabetes mellitus without complications: Secondary | ICD-10-CM | POA: Diagnosis not present

## 2016-10-16 DIAGNOSIS — E11622 Type 2 diabetes mellitus with other skin ulcer: Secondary | ICD-10-CM | POA: Diagnosis not present

## 2016-10-16 DIAGNOSIS — I872 Venous insufficiency (chronic) (peripheral): Secondary | ICD-10-CM | POA: Diagnosis not present

## 2016-10-16 DIAGNOSIS — E119 Type 2 diabetes mellitus without complications: Secondary | ICD-10-CM | POA: Diagnosis not present

## 2016-10-16 DIAGNOSIS — L97928 Non-pressure chronic ulcer of unspecified part of left lower leg with other specified severity: Secondary | ICD-10-CM | POA: Diagnosis not present

## 2016-10-27 DIAGNOSIS — E11622 Type 2 diabetes mellitus with other skin ulcer: Secondary | ICD-10-CM | POA: Diagnosis not present

## 2016-10-27 DIAGNOSIS — E119 Type 2 diabetes mellitus without complications: Secondary | ICD-10-CM | POA: Diagnosis not present

## 2016-10-27 DIAGNOSIS — L97928 Non-pressure chronic ulcer of unspecified part of left lower leg with other specified severity: Secondary | ICD-10-CM | POA: Diagnosis not present

## 2016-10-27 DIAGNOSIS — I872 Venous insufficiency (chronic) (peripheral): Secondary | ICD-10-CM | POA: Diagnosis not present

## 2016-11-03 DIAGNOSIS — I872 Venous insufficiency (chronic) (peripheral): Secondary | ICD-10-CM | POA: Diagnosis not present

## 2016-11-03 DIAGNOSIS — E119 Type 2 diabetes mellitus without complications: Secondary | ICD-10-CM | POA: Diagnosis not present

## 2016-11-03 DIAGNOSIS — Z09 Encounter for follow-up examination after completed treatment for conditions other than malignant neoplasm: Secondary | ICD-10-CM | POA: Diagnosis not present

## 2016-11-03 DIAGNOSIS — I890 Lymphedema, not elsewhere classified: Secondary | ICD-10-CM | POA: Diagnosis not present

## 2016-11-09 ENCOUNTER — Other Ambulatory Visit: Payer: Self-pay | Admitting: "Endocrinology

## 2016-11-10 ENCOUNTER — Other Ambulatory Visit: Payer: Self-pay | Admitting: "Endocrinology

## 2016-11-10 DIAGNOSIS — Z09 Encounter for follow-up examination after completed treatment for conditions other than malignant neoplasm: Secondary | ICD-10-CM | POA: Diagnosis not present

## 2016-11-10 DIAGNOSIS — E119 Type 2 diabetes mellitus without complications: Secondary | ICD-10-CM | POA: Diagnosis not present

## 2016-11-10 DIAGNOSIS — I872 Venous insufficiency (chronic) (peripheral): Secondary | ICD-10-CM | POA: Diagnosis not present

## 2016-11-10 DIAGNOSIS — I890 Lymphedema, not elsewhere classified: Secondary | ICD-10-CM | POA: Diagnosis not present

## 2016-11-18 DIAGNOSIS — E119 Type 2 diabetes mellitus without complications: Secondary | ICD-10-CM | POA: Diagnosis not present

## 2016-11-18 DIAGNOSIS — I872 Venous insufficiency (chronic) (peripheral): Secondary | ICD-10-CM | POA: Diagnosis not present

## 2016-11-18 DIAGNOSIS — Z09 Encounter for follow-up examination after completed treatment for conditions other than malignant neoplasm: Secondary | ICD-10-CM | POA: Diagnosis not present

## 2016-11-18 DIAGNOSIS — I890 Lymphedema, not elsewhere classified: Secondary | ICD-10-CM | POA: Diagnosis not present

## 2016-11-26 DIAGNOSIS — R2243 Localized swelling, mass and lump, lower limb, bilateral: Secondary | ICD-10-CM | POA: Diagnosis not present

## 2016-11-26 DIAGNOSIS — I872 Venous insufficiency (chronic) (peripheral): Secondary | ICD-10-CM | POA: Diagnosis not present

## 2016-11-26 DIAGNOSIS — E119 Type 2 diabetes mellitus without complications: Secondary | ICD-10-CM | POA: Diagnosis not present

## 2016-11-26 DIAGNOSIS — I890 Lymphedema, not elsewhere classified: Secondary | ICD-10-CM | POA: Diagnosis not present

## 2016-11-26 DIAGNOSIS — Z09 Encounter for follow-up examination after completed treatment for conditions other than malignant neoplasm: Secondary | ICD-10-CM | POA: Diagnosis not present

## 2016-12-08 DIAGNOSIS — I872 Venous insufficiency (chronic) (peripheral): Secondary | ICD-10-CM | POA: Diagnosis not present

## 2016-12-08 DIAGNOSIS — E119 Type 2 diabetes mellitus without complications: Secondary | ICD-10-CM | POA: Diagnosis not present

## 2016-12-08 DIAGNOSIS — I890 Lymphedema, not elsewhere classified: Secondary | ICD-10-CM | POA: Diagnosis not present

## 2016-12-15 DIAGNOSIS — I10 Essential (primary) hypertension: Secondary | ICD-10-CM | POA: Diagnosis not present

## 2016-12-15 DIAGNOSIS — Z139 Encounter for screening, unspecified: Secondary | ICD-10-CM | POA: Diagnosis not present

## 2016-12-15 DIAGNOSIS — M542 Cervicalgia: Secondary | ICD-10-CM | POA: Diagnosis not present

## 2016-12-15 DIAGNOSIS — E119 Type 2 diabetes mellitus without complications: Secondary | ICD-10-CM | POA: Diagnosis not present

## 2016-12-15 DIAGNOSIS — Z6841 Body Mass Index (BMI) 40.0 and over, adult: Secondary | ICD-10-CM | POA: Diagnosis not present

## 2016-12-15 DIAGNOSIS — E785 Hyperlipidemia, unspecified: Secondary | ICD-10-CM | POA: Diagnosis not present

## 2016-12-21 DIAGNOSIS — I890 Lymphedema, not elsewhere classified: Secondary | ICD-10-CM | POA: Diagnosis not present

## 2016-12-21 DIAGNOSIS — I872 Venous insufficiency (chronic) (peripheral): Secondary | ICD-10-CM | POA: Diagnosis not present

## 2016-12-21 DIAGNOSIS — E119 Type 2 diabetes mellitus without complications: Secondary | ICD-10-CM | POA: Diagnosis not present

## 2016-12-28 DIAGNOSIS — E119 Type 2 diabetes mellitus without complications: Secondary | ICD-10-CM | POA: Diagnosis not present

## 2016-12-28 DIAGNOSIS — I890 Lymphedema, not elsewhere classified: Secondary | ICD-10-CM | POA: Diagnosis not present

## 2016-12-28 DIAGNOSIS — I872 Venous insufficiency (chronic) (peripheral): Secondary | ICD-10-CM | POA: Diagnosis not present

## 2017-01-01 DIAGNOSIS — T8189XD Other complications of procedures, not elsewhere classified, subsequent encounter: Secondary | ICD-10-CM | POA: Diagnosis not present

## 2017-01-01 DIAGNOSIS — E119 Type 2 diabetes mellitus without complications: Secondary | ICD-10-CM | POA: Diagnosis not present

## 2017-01-01 DIAGNOSIS — I872 Venous insufficiency (chronic) (peripheral): Secondary | ICD-10-CM | POA: Diagnosis not present

## 2017-01-01 DIAGNOSIS — I890 Lymphedema, not elsewhere classified: Secondary | ICD-10-CM | POA: Diagnosis not present

## 2017-01-05 DIAGNOSIS — L97321 Non-pressure chronic ulcer of left ankle limited to breakdown of skin: Secondary | ICD-10-CM | POA: Diagnosis not present

## 2017-01-05 DIAGNOSIS — T8189XD Other complications of procedures, not elsewhere classified, subsequent encounter: Secondary | ICD-10-CM | POA: Diagnosis not present

## 2017-01-05 DIAGNOSIS — I890 Lymphedema, not elsewhere classified: Secondary | ICD-10-CM | POA: Diagnosis not present

## 2017-01-05 DIAGNOSIS — I872 Venous insufficiency (chronic) (peripheral): Secondary | ICD-10-CM | POA: Diagnosis not present

## 2017-01-05 DIAGNOSIS — E119 Type 2 diabetes mellitus without complications: Secondary | ICD-10-CM | POA: Diagnosis not present

## 2017-01-13 DIAGNOSIS — T8189XD Other complications of procedures, not elsewhere classified, subsequent encounter: Secondary | ICD-10-CM | POA: Diagnosis not present

## 2017-01-13 DIAGNOSIS — L84 Corns and callosities: Secondary | ICD-10-CM | POA: Diagnosis not present

## 2017-01-13 DIAGNOSIS — E119 Type 2 diabetes mellitus without complications: Secondary | ICD-10-CM | POA: Diagnosis not present

## 2017-01-13 DIAGNOSIS — I872 Venous insufficiency (chronic) (peripheral): Secondary | ICD-10-CM | POA: Diagnosis not present

## 2017-01-19 DIAGNOSIS — T8189XD Other complications of procedures, not elsewhere classified, subsequent encounter: Secondary | ICD-10-CM | POA: Diagnosis not present

## 2017-01-19 DIAGNOSIS — I872 Venous insufficiency (chronic) (peripheral): Secondary | ICD-10-CM | POA: Diagnosis not present

## 2017-01-26 DIAGNOSIS — T8189XD Other complications of procedures, not elsewhere classified, subsequent encounter: Secondary | ICD-10-CM | POA: Diagnosis not present

## 2017-01-26 DIAGNOSIS — E118 Type 2 diabetes mellitus with unspecified complications: Secondary | ICD-10-CM | POA: Diagnosis not present

## 2017-01-26 DIAGNOSIS — I872 Venous insufficiency (chronic) (peripheral): Secondary | ICD-10-CM | POA: Diagnosis not present

## 2017-01-26 DIAGNOSIS — L84 Corns and callosities: Secondary | ICD-10-CM | POA: Diagnosis not present

## 2017-02-05 DIAGNOSIS — L84 Corns and callosities: Secondary | ICD-10-CM | POA: Diagnosis not present

## 2017-02-05 DIAGNOSIS — L97221 Non-pressure chronic ulcer of left calf limited to breakdown of skin: Secondary | ICD-10-CM | POA: Diagnosis not present

## 2017-02-05 DIAGNOSIS — L97521 Non-pressure chronic ulcer of other part of left foot limited to breakdown of skin: Secondary | ICD-10-CM | POA: Diagnosis not present

## 2017-02-05 DIAGNOSIS — E11621 Type 2 diabetes mellitus with foot ulcer: Secondary | ICD-10-CM | POA: Diagnosis not present

## 2017-02-05 DIAGNOSIS — I872 Venous insufficiency (chronic) (peripheral): Secondary | ICD-10-CM | POA: Diagnosis not present

## 2017-02-12 DIAGNOSIS — E11621 Type 2 diabetes mellitus with foot ulcer: Secondary | ICD-10-CM | POA: Diagnosis not present

## 2017-02-12 DIAGNOSIS — L97521 Non-pressure chronic ulcer of other part of left foot limited to breakdown of skin: Secondary | ICD-10-CM | POA: Diagnosis not present

## 2017-02-12 DIAGNOSIS — I872 Venous insufficiency (chronic) (peripheral): Secondary | ICD-10-CM | POA: Diagnosis not present

## 2017-02-18 DIAGNOSIS — E119 Type 2 diabetes mellitus without complications: Secondary | ICD-10-CM | POA: Diagnosis not present

## 2017-02-18 DIAGNOSIS — I872 Venous insufficiency (chronic) (peripheral): Secondary | ICD-10-CM | POA: Diagnosis not present

## 2017-02-18 DIAGNOSIS — L97521 Non-pressure chronic ulcer of other part of left foot limited to breakdown of skin: Secondary | ICD-10-CM | POA: Diagnosis not present

## 2017-02-18 DIAGNOSIS — E11621 Type 2 diabetes mellitus with foot ulcer: Secondary | ICD-10-CM | POA: Diagnosis not present

## 2017-02-20 ENCOUNTER — Other Ambulatory Visit: Payer: Self-pay | Admitting: "Endocrinology

## 2017-02-25 DIAGNOSIS — L84 Corns and callosities: Secondary | ICD-10-CM | POA: Diagnosis not present

## 2017-02-25 DIAGNOSIS — I872 Venous insufficiency (chronic) (peripheral): Secondary | ICD-10-CM | POA: Diagnosis not present

## 2017-02-25 DIAGNOSIS — E11621 Type 2 diabetes mellitus with foot ulcer: Secondary | ICD-10-CM | POA: Diagnosis not present

## 2017-02-25 DIAGNOSIS — L97521 Non-pressure chronic ulcer of other part of left foot limited to breakdown of skin: Secondary | ICD-10-CM | POA: Diagnosis not present

## 2017-03-04 DIAGNOSIS — E11621 Type 2 diabetes mellitus with foot ulcer: Secondary | ICD-10-CM | POA: Diagnosis not present

## 2017-03-04 DIAGNOSIS — I872 Venous insufficiency (chronic) (peripheral): Secondary | ICD-10-CM | POA: Diagnosis not present

## 2017-03-11 DIAGNOSIS — E11621 Type 2 diabetes mellitus with foot ulcer: Secondary | ICD-10-CM | POA: Diagnosis not present

## 2017-03-11 DIAGNOSIS — I872 Venous insufficiency (chronic) (peripheral): Secondary | ICD-10-CM | POA: Diagnosis not present

## 2017-03-18 DIAGNOSIS — L84 Corns and callosities: Secondary | ICD-10-CM | POA: Diagnosis not present

## 2017-03-18 DIAGNOSIS — I872 Venous insufficiency (chronic) (peripheral): Secondary | ICD-10-CM | POA: Diagnosis not present

## 2017-03-18 DIAGNOSIS — E11621 Type 2 diabetes mellitus with foot ulcer: Secondary | ICD-10-CM | POA: Diagnosis not present

## 2017-03-19 DIAGNOSIS — Z139 Encounter for screening, unspecified: Secondary | ICD-10-CM | POA: Diagnosis not present

## 2017-03-19 DIAGNOSIS — E119 Type 2 diabetes mellitus without complications: Secondary | ICD-10-CM | POA: Diagnosis not present

## 2017-03-19 DIAGNOSIS — Z789 Other specified health status: Secondary | ICD-10-CM | POA: Diagnosis not present

## 2017-03-19 DIAGNOSIS — Z1389 Encounter for screening for other disorder: Secondary | ICD-10-CM | POA: Diagnosis not present

## 2017-03-19 DIAGNOSIS — F419 Anxiety disorder, unspecified: Secondary | ICD-10-CM | POA: Diagnosis not present

## 2017-03-19 DIAGNOSIS — Z0000 Encounter for general adult medical examination without abnormal findings: Secondary | ICD-10-CM | POA: Diagnosis not present

## 2017-03-24 DIAGNOSIS — E118 Type 2 diabetes mellitus with unspecified complications: Secondary | ICD-10-CM | POA: Diagnosis not present

## 2017-03-24 DIAGNOSIS — I872 Venous insufficiency (chronic) (peripheral): Secondary | ICD-10-CM | POA: Diagnosis not present

## 2017-03-24 DIAGNOSIS — L03032 Cellulitis of left toe: Secondary | ICD-10-CM | POA: Diagnosis not present

## 2017-03-31 DIAGNOSIS — L97521 Non-pressure chronic ulcer of other part of left foot limited to breakdown of skin: Secondary | ICD-10-CM | POA: Diagnosis not present

## 2017-03-31 DIAGNOSIS — I872 Venous insufficiency (chronic) (peripheral): Secondary | ICD-10-CM | POA: Diagnosis not present

## 2017-03-31 DIAGNOSIS — E11621 Type 2 diabetes mellitus with foot ulcer: Secondary | ICD-10-CM | POA: Diagnosis not present

## 2017-03-31 DIAGNOSIS — L97511 Non-pressure chronic ulcer of other part of right foot limited to breakdown of skin: Secondary | ICD-10-CM | POA: Diagnosis not present

## 2017-04-08 DIAGNOSIS — E11621 Type 2 diabetes mellitus with foot ulcer: Secondary | ICD-10-CM | POA: Diagnosis not present

## 2017-04-08 DIAGNOSIS — I872 Venous insufficiency (chronic) (peripheral): Secondary | ICD-10-CM | POA: Diagnosis not present

## 2017-04-08 DIAGNOSIS — L97521 Non-pressure chronic ulcer of other part of left foot limited to breakdown of skin: Secondary | ICD-10-CM | POA: Diagnosis not present

## 2017-04-15 DIAGNOSIS — I872 Venous insufficiency (chronic) (peripheral): Secondary | ICD-10-CM | POA: Diagnosis not present

## 2017-04-15 DIAGNOSIS — L97521 Non-pressure chronic ulcer of other part of left foot limited to breakdown of skin: Secondary | ICD-10-CM | POA: Diagnosis not present

## 2017-04-15 DIAGNOSIS — E11621 Type 2 diabetes mellitus with foot ulcer: Secondary | ICD-10-CM | POA: Diagnosis not present

## 2017-04-23 DIAGNOSIS — L97521 Non-pressure chronic ulcer of other part of left foot limited to breakdown of skin: Secondary | ICD-10-CM | POA: Diagnosis not present

## 2017-04-23 DIAGNOSIS — I872 Venous insufficiency (chronic) (peripheral): Secondary | ICD-10-CM | POA: Diagnosis not present

## 2017-04-23 DIAGNOSIS — E11621 Type 2 diabetes mellitus with foot ulcer: Secondary | ICD-10-CM | POA: Diagnosis not present

## 2017-04-29 DIAGNOSIS — L600 Ingrowing nail: Secondary | ICD-10-CM | POA: Diagnosis not present

## 2017-04-29 DIAGNOSIS — I872 Venous insufficiency (chronic) (peripheral): Secondary | ICD-10-CM | POA: Diagnosis not present

## 2017-04-29 DIAGNOSIS — L97521 Non-pressure chronic ulcer of other part of left foot limited to breakdown of skin: Secondary | ICD-10-CM | POA: Diagnosis not present

## 2017-04-29 DIAGNOSIS — E11621 Type 2 diabetes mellitus with foot ulcer: Secondary | ICD-10-CM | POA: Diagnosis not present

## 2017-04-29 DIAGNOSIS — E118 Type 2 diabetes mellitus with unspecified complications: Secondary | ICD-10-CM | POA: Diagnosis not present

## 2017-05-06 DIAGNOSIS — I872 Venous insufficiency (chronic) (peripheral): Secondary | ICD-10-CM | POA: Diagnosis not present

## 2017-05-13 DIAGNOSIS — E119 Type 2 diabetes mellitus without complications: Secondary | ICD-10-CM | POA: Diagnosis not present

## 2017-05-13 DIAGNOSIS — I872 Venous insufficiency (chronic) (peripheral): Secondary | ICD-10-CM | POA: Diagnosis not present

## 2017-05-20 DIAGNOSIS — E11621 Type 2 diabetes mellitus with foot ulcer: Secondary | ICD-10-CM | POA: Diagnosis not present

## 2017-05-20 DIAGNOSIS — L97512 Non-pressure chronic ulcer of other part of right foot with fat layer exposed: Secondary | ICD-10-CM | POA: Diagnosis not present

## 2017-05-20 DIAGNOSIS — I872 Venous insufficiency (chronic) (peripheral): Secondary | ICD-10-CM | POA: Diagnosis not present

## 2017-05-28 DIAGNOSIS — I872 Venous insufficiency (chronic) (peripheral): Secondary | ICD-10-CM | POA: Diagnosis not present

## 2017-05-28 DIAGNOSIS — E119 Type 2 diabetes mellitus without complications: Secondary | ICD-10-CM | POA: Diagnosis not present

## 2017-05-28 DIAGNOSIS — L539 Erythematous condition, unspecified: Secondary | ICD-10-CM | POA: Diagnosis not present

## 2017-06-03 DIAGNOSIS — L539 Erythematous condition, unspecified: Secondary | ICD-10-CM | POA: Diagnosis not present

## 2017-06-03 DIAGNOSIS — L97512 Non-pressure chronic ulcer of other part of right foot with fat layer exposed: Secondary | ICD-10-CM | POA: Diagnosis not present

## 2017-06-03 DIAGNOSIS — E119 Type 2 diabetes mellitus without complications: Secondary | ICD-10-CM | POA: Diagnosis not present

## 2017-06-03 DIAGNOSIS — E11621 Type 2 diabetes mellitus with foot ulcer: Secondary | ICD-10-CM | POA: Diagnosis not present

## 2017-06-03 DIAGNOSIS — Z0289 Encounter for other administrative examinations: Secondary | ICD-10-CM | POA: Diagnosis not present

## 2017-06-03 DIAGNOSIS — L03031 Cellulitis of right toe: Secondary | ICD-10-CM | POA: Diagnosis not present

## 2017-06-03 DIAGNOSIS — L97518 Non-pressure chronic ulcer of other part of right foot with other specified severity: Secondary | ICD-10-CM | POA: Diagnosis not present

## 2017-06-03 DIAGNOSIS — I872 Venous insufficiency (chronic) (peripheral): Secondary | ICD-10-CM | POA: Diagnosis not present

## 2017-06-04 DIAGNOSIS — G4733 Obstructive sleep apnea (adult) (pediatric): Secondary | ICD-10-CM | POA: Diagnosis present

## 2017-06-04 DIAGNOSIS — Z794 Long term (current) use of insulin: Secondary | ICD-10-CM | POA: Diagnosis not present

## 2017-06-04 DIAGNOSIS — L97519 Non-pressure chronic ulcer of other part of right foot with unspecified severity: Secondary | ICD-10-CM | POA: Diagnosis present

## 2017-06-04 DIAGNOSIS — Z7982 Long term (current) use of aspirin: Secondary | ICD-10-CM | POA: Diagnosis not present

## 2017-06-04 DIAGNOSIS — Z91030 Bee allergy status: Secondary | ICD-10-CM | POA: Diagnosis not present

## 2017-06-04 DIAGNOSIS — E11621 Type 2 diabetes mellitus with foot ulcer: Secondary | ICD-10-CM | POA: Diagnosis not present

## 2017-06-04 DIAGNOSIS — E119 Type 2 diabetes mellitus without complications: Secondary | ICD-10-CM | POA: Diagnosis not present

## 2017-06-04 DIAGNOSIS — I872 Venous insufficiency (chronic) (peripheral): Secondary | ICD-10-CM | POA: Diagnosis not present

## 2017-06-04 DIAGNOSIS — F329 Major depressive disorder, single episode, unspecified: Secondary | ICD-10-CM | POA: Diagnosis present

## 2017-06-04 DIAGNOSIS — Z7901 Long term (current) use of anticoagulants: Secondary | ICD-10-CM | POA: Diagnosis not present

## 2017-06-04 DIAGNOSIS — Z79891 Long term (current) use of opiate analgesic: Secondary | ICD-10-CM | POA: Diagnosis not present

## 2017-06-04 DIAGNOSIS — L03031 Cellulitis of right toe: Secondary | ICD-10-CM | POA: Diagnosis not present

## 2017-06-04 DIAGNOSIS — G629 Polyneuropathy, unspecified: Secondary | ICD-10-CM | POA: Diagnosis present

## 2017-06-04 DIAGNOSIS — L97512 Non-pressure chronic ulcer of other part of right foot with fat layer exposed: Secondary | ICD-10-CM | POA: Diagnosis not present

## 2017-06-04 DIAGNOSIS — Z86711 Personal history of pulmonary embolism: Secondary | ICD-10-CM | POA: Diagnosis not present

## 2017-06-04 DIAGNOSIS — I4891 Unspecified atrial fibrillation: Secondary | ICD-10-CM | POA: Diagnosis present

## 2017-06-15 DIAGNOSIS — I872 Venous insufficiency (chronic) (peripheral): Secondary | ICD-10-CM | POA: Diagnosis not present

## 2017-06-15 DIAGNOSIS — L97518 Non-pressure chronic ulcer of other part of right foot with other specified severity: Secondary | ICD-10-CM | POA: Diagnosis not present

## 2017-06-15 DIAGNOSIS — L84 Corns and callosities: Secondary | ICD-10-CM | POA: Diagnosis not present

## 2017-06-15 DIAGNOSIS — E11621 Type 2 diabetes mellitus with foot ulcer: Secondary | ICD-10-CM | POA: Diagnosis not present

## 2017-06-22 DIAGNOSIS — I872 Venous insufficiency (chronic) (peripheral): Secondary | ICD-10-CM | POA: Diagnosis not present

## 2017-06-22 DIAGNOSIS — E11621 Type 2 diabetes mellitus with foot ulcer: Secondary | ICD-10-CM | POA: Diagnosis not present

## 2017-06-22 DIAGNOSIS — L84 Corns and callosities: Secondary | ICD-10-CM | POA: Diagnosis not present

## 2017-06-22 DIAGNOSIS — L97518 Non-pressure chronic ulcer of other part of right foot with other specified severity: Secondary | ICD-10-CM | POA: Diagnosis not present

## 2017-06-24 DIAGNOSIS — Z1389 Encounter for screening for other disorder: Secondary | ICD-10-CM | POA: Diagnosis not present

## 2017-06-24 DIAGNOSIS — Z139 Encounter for screening, unspecified: Secondary | ICD-10-CM | POA: Diagnosis not present

## 2017-06-24 DIAGNOSIS — I10 Essential (primary) hypertension: Secondary | ICD-10-CM | POA: Diagnosis not present

## 2017-06-24 DIAGNOSIS — Z789 Other specified health status: Secondary | ICD-10-CM | POA: Diagnosis not present

## 2017-06-24 DIAGNOSIS — E119 Type 2 diabetes mellitus without complications: Secondary | ICD-10-CM | POA: Diagnosis not present

## 2017-06-24 DIAGNOSIS — E785 Hyperlipidemia, unspecified: Secondary | ICD-10-CM | POA: Diagnosis not present

## 2017-06-24 DIAGNOSIS — Z6841 Body Mass Index (BMI) 40.0 and over, adult: Secondary | ICD-10-CM | POA: Diagnosis not present

## 2017-06-28 DIAGNOSIS — L97512 Non-pressure chronic ulcer of other part of right foot with fat layer exposed: Secondary | ICD-10-CM | POA: Diagnosis not present

## 2017-06-28 DIAGNOSIS — I872 Venous insufficiency (chronic) (peripheral): Secondary | ICD-10-CM | POA: Diagnosis not present

## 2017-06-28 DIAGNOSIS — E11621 Type 2 diabetes mellitus with foot ulcer: Secondary | ICD-10-CM | POA: Diagnosis not present

## 2017-06-28 DIAGNOSIS — L97518 Non-pressure chronic ulcer of other part of right foot with other specified severity: Secondary | ICD-10-CM | POA: Diagnosis not present

## 2017-06-30 DIAGNOSIS — L97518 Non-pressure chronic ulcer of other part of right foot with other specified severity: Secondary | ICD-10-CM | POA: Diagnosis not present

## 2017-06-30 DIAGNOSIS — L97512 Non-pressure chronic ulcer of other part of right foot with fat layer exposed: Secondary | ICD-10-CM | POA: Diagnosis not present

## 2017-06-30 DIAGNOSIS — E11621 Type 2 diabetes mellitus with foot ulcer: Secondary | ICD-10-CM | POA: Diagnosis not present

## 2017-06-30 DIAGNOSIS — I872 Venous insufficiency (chronic) (peripheral): Secondary | ICD-10-CM | POA: Diagnosis not present

## 2017-07-02 DIAGNOSIS — I872 Venous insufficiency (chronic) (peripheral): Secondary | ICD-10-CM | POA: Diagnosis not present

## 2017-07-02 DIAGNOSIS — E11621 Type 2 diabetes mellitus with foot ulcer: Secondary | ICD-10-CM | POA: Diagnosis not present

## 2017-07-02 DIAGNOSIS — L97518 Non-pressure chronic ulcer of other part of right foot with other specified severity: Secondary | ICD-10-CM | POA: Diagnosis not present

## 2017-07-02 DIAGNOSIS — L97519 Non-pressure chronic ulcer of other part of right foot with unspecified severity: Secondary | ICD-10-CM | POA: Diagnosis not present

## 2017-07-09 DIAGNOSIS — G629 Polyneuropathy, unspecified: Secondary | ICD-10-CM | POA: Diagnosis not present

## 2017-07-09 DIAGNOSIS — E11621 Type 2 diabetes mellitus with foot ulcer: Secondary | ICD-10-CM | POA: Diagnosis not present

## 2017-07-09 DIAGNOSIS — I872 Venous insufficiency (chronic) (peripheral): Secondary | ICD-10-CM | POA: Diagnosis not present

## 2017-07-09 DIAGNOSIS — L97518 Non-pressure chronic ulcer of other part of right foot with other specified severity: Secondary | ICD-10-CM | POA: Diagnosis not present

## 2017-07-09 DIAGNOSIS — L97511 Non-pressure chronic ulcer of other part of right foot limited to breakdown of skin: Secondary | ICD-10-CM | POA: Diagnosis not present

## 2017-07-15 DIAGNOSIS — L97511 Non-pressure chronic ulcer of other part of right foot limited to breakdown of skin: Secondary | ICD-10-CM | POA: Diagnosis not present

## 2017-07-15 DIAGNOSIS — G629 Polyneuropathy, unspecified: Secondary | ICD-10-CM | POA: Diagnosis not present

## 2017-07-15 DIAGNOSIS — E11621 Type 2 diabetes mellitus with foot ulcer: Secondary | ICD-10-CM | POA: Diagnosis not present

## 2017-07-15 DIAGNOSIS — I872 Venous insufficiency (chronic) (peripheral): Secondary | ICD-10-CM | POA: Diagnosis not present

## 2017-07-15 DIAGNOSIS — L97518 Non-pressure chronic ulcer of other part of right foot with other specified severity: Secondary | ICD-10-CM | POA: Diagnosis not present

## 2017-07-22 DIAGNOSIS — E11621 Type 2 diabetes mellitus with foot ulcer: Secondary | ICD-10-CM | POA: Diagnosis not present

## 2017-07-22 DIAGNOSIS — L97511 Non-pressure chronic ulcer of other part of right foot limited to breakdown of skin: Secondary | ICD-10-CM | POA: Diagnosis not present

## 2017-07-22 DIAGNOSIS — L97518 Non-pressure chronic ulcer of other part of right foot with other specified severity: Secondary | ICD-10-CM | POA: Diagnosis not present

## 2017-07-22 DIAGNOSIS — I872 Venous insufficiency (chronic) (peripheral): Secondary | ICD-10-CM | POA: Diagnosis not present

## 2017-07-29 DIAGNOSIS — L84 Corns and callosities: Secondary | ICD-10-CM | POA: Diagnosis not present

## 2017-07-29 DIAGNOSIS — E11621 Type 2 diabetes mellitus with foot ulcer: Secondary | ICD-10-CM | POA: Diagnosis not present

## 2017-07-29 DIAGNOSIS — L97511 Non-pressure chronic ulcer of other part of right foot limited to breakdown of skin: Secondary | ICD-10-CM | POA: Diagnosis not present

## 2017-07-29 DIAGNOSIS — L97518 Non-pressure chronic ulcer of other part of right foot with other specified severity: Secondary | ICD-10-CM | POA: Diagnosis not present

## 2017-07-29 DIAGNOSIS — I872 Venous insufficiency (chronic) (peripheral): Secondary | ICD-10-CM | POA: Diagnosis not present

## 2017-08-27 DIAGNOSIS — L97511 Non-pressure chronic ulcer of other part of right foot limited to breakdown of skin: Secondary | ICD-10-CM | POA: Diagnosis not present

## 2017-08-27 DIAGNOSIS — L97212 Non-pressure chronic ulcer of right calf with fat layer exposed: Secondary | ICD-10-CM | POA: Diagnosis not present

## 2017-08-27 DIAGNOSIS — E11621 Type 2 diabetes mellitus with foot ulcer: Secondary | ICD-10-CM | POA: Diagnosis not present

## 2017-08-27 DIAGNOSIS — I872 Venous insufficiency (chronic) (peripheral): Secondary | ICD-10-CM | POA: Diagnosis not present

## 2017-09-01 DIAGNOSIS — I872 Venous insufficiency (chronic) (peripheral): Secondary | ICD-10-CM | POA: Diagnosis not present

## 2017-09-01 DIAGNOSIS — L97511 Non-pressure chronic ulcer of other part of right foot limited to breakdown of skin: Secondary | ICD-10-CM | POA: Diagnosis not present

## 2017-09-01 DIAGNOSIS — G629 Polyneuropathy, unspecified: Secondary | ICD-10-CM | POA: Diagnosis not present

## 2017-09-01 DIAGNOSIS — E11621 Type 2 diabetes mellitus with foot ulcer: Secondary | ICD-10-CM | POA: Diagnosis not present

## 2017-09-01 DIAGNOSIS — M7989 Other specified soft tissue disorders: Secondary | ICD-10-CM | POA: Diagnosis not present

## 2017-09-01 DIAGNOSIS — L97211 Non-pressure chronic ulcer of right calf limited to breakdown of skin: Secondary | ICD-10-CM | POA: Diagnosis not present

## 2017-10-06 DIAGNOSIS — E13621 Other specified diabetes mellitus with foot ulcer: Secondary | ICD-10-CM | POA: Diagnosis not present

## 2017-10-06 DIAGNOSIS — S90822A Blister (nonthermal), left foot, initial encounter: Secondary | ICD-10-CM | POA: Diagnosis not present

## 2017-11-22 DIAGNOSIS — R0602 Shortness of breath: Secondary | ICD-10-CM | POA: Diagnosis not present

## 2017-11-24 DIAGNOSIS — R0602 Shortness of breath: Secondary | ICD-10-CM | POA: Diagnosis not present

## 2017-11-24 DIAGNOSIS — I482 Chronic atrial fibrillation: Secondary | ICD-10-CM | POA: Diagnosis not present

## 2017-11-24 DIAGNOSIS — Z79899 Other long term (current) drug therapy: Secondary | ICD-10-CM | POA: Diagnosis not present

## 2017-11-24 DIAGNOSIS — I5030 Unspecified diastolic (congestive) heart failure: Secondary | ICD-10-CM | POA: Diagnosis not present

## 2017-11-24 DIAGNOSIS — Z7901 Long term (current) use of anticoagulants: Secondary | ICD-10-CM | POA: Diagnosis not present

## 2017-11-26 DIAGNOSIS — R0602 Shortness of breath: Secondary | ICD-10-CM | POA: Diagnosis not present

## 2017-11-26 DIAGNOSIS — I482 Chronic atrial fibrillation: Secondary | ICD-10-CM | POA: Diagnosis not present

## 2017-11-26 DIAGNOSIS — I350 Nonrheumatic aortic (valve) stenosis: Secondary | ICD-10-CM | POA: Diagnosis not present

## 2020-11-06 ENCOUNTER — Inpatient Hospital Stay
Admission: RE | Admit: 2020-11-06 | Discharge: 2020-11-21 | Disposition: A | Discharge status: Skilled Nursing Facility | Payer: BLUE CROSS/BLUE SHIELD | Source: Other Acute Inpatient Hospital | Attending: Internal Medicine | Admitting: Internal Medicine

## 2020-11-06 ENCOUNTER — Inpatient Hospital Stay
Admission: RE | Admit: 2020-11-06 | Payer: Self-pay | Source: Other Acute Inpatient Hospital | Admitting: Internal Medicine

## 2020-11-06 ENCOUNTER — Other Ambulatory Visit (HOSPITAL_COMMUNITY): Payer: BLUE CROSS/BLUE SHIELD

## 2020-11-06 DIAGNOSIS — Z9289 Personal history of other medical treatment: Secondary | ICD-10-CM

## 2020-11-06 DIAGNOSIS — J9690 Respiratory failure, unspecified, unspecified whether with hypoxia or hypercapnia: Secondary | ICD-10-CM

## 2020-11-06 DIAGNOSIS — I509 Heart failure, unspecified: Secondary | ICD-10-CM

## 2020-11-06 LAB — CBC WITH DIFFERENTIAL/PLATELET
Abs Immature Granulocytes: 0.02 10*3/uL (ref 0.00–0.07)
Basophils Absolute: 0 10*3/uL (ref 0.0–0.1)
Basophils Relative: 0 %
Eosinophils Absolute: 0.1 10*3/uL (ref 0.0–0.5)
Eosinophils Relative: 2 %
HCT: 27.6 % — ABNORMAL LOW (ref 36.0–46.0)
Hemoglobin: 8.8 g/dL — ABNORMAL LOW (ref 12.0–15.0)
Immature Granulocytes: 0 %
Lymphocytes Relative: 18 %
Lymphs Abs: 0.8 10*3/uL (ref 0.7–4.0)
MCH: 28.7 pg (ref 26.0–34.0)
MCHC: 31.9 g/dL (ref 30.0–36.0)
MCV: 89.9 fL (ref 80.0–100.0)
Monocytes Absolute: 0.6 10*3/uL (ref 0.1–1.0)
Monocytes Relative: 12 %
Neutro Abs: 3.1 10*3/uL (ref 1.7–7.7)
Neutrophils Relative %: 68 %
Platelets: 222 10*3/uL (ref 150–400)
RBC: 3.07 MIL/uL — ABNORMAL LOW (ref 3.87–5.11)
RDW: 16.1 % — ABNORMAL HIGH (ref 11.5–15.5)
WBC: 4.6 10*3/uL (ref 4.0–10.5)
nRBC: 0 % (ref 0.0–0.2)

## 2020-11-06 LAB — RENAL FUNCTION PANEL
Albumin: 2.6 g/dL — ABNORMAL LOW (ref 3.5–5.0)
Anion gap: 10 (ref 5–15)
BUN: 12 mg/dL (ref 8–23)
CO2: 23 mmol/L (ref 22–32)
Calcium: 8.5 mg/dL — ABNORMAL LOW (ref 8.9–10.3)
Chloride: 94 mmol/L — ABNORMAL LOW (ref 98–111)
Creatinine, Ser: 3.26 mg/dL — ABNORMAL HIGH (ref 0.44–1.00)
GFR, Estimated: 15 mL/min — ABNORMAL LOW (ref >60)
Glucose, Bld: 126 mg/dL — ABNORMAL HIGH (ref 70–99)
Phosphorus: 4.4 mg/dL (ref 2.5–4.6)
Potassium: 4 mmol/L (ref 3.5–5.1)
Sodium: 127 mmol/L — ABNORMAL LOW (ref 135–145)

## 2020-11-06 LAB — CBC
HCT: 30 % — ABNORMAL LOW (ref 36.0–46.0)
Hemoglobin: 9.5 g/dL — ABNORMAL LOW (ref 12.0–15.0)
MCH: 28.6 pg (ref 26.0–34.0)
MCHC: 31.7 g/dL (ref 30.0–36.0)
MCV: 90.4 fL (ref 80.0–100.0)
Platelets: 228 10*3/uL (ref 150–400)
RBC: 3.32 MIL/uL — ABNORMAL LOW (ref 3.87–5.11)
RDW: 16.1 % — ABNORMAL HIGH (ref 11.5–15.5)
WBC: 4.6 10*3/uL (ref 4.0–10.5)
nRBC: 0 % (ref 0.0–0.2)

## 2020-11-06 LAB — PHOSPHORUS: Phosphorus: 4.1 mg/dL (ref 2.5–4.6)

## 2020-11-06 LAB — COMPREHENSIVE METABOLIC PANEL
ALT: 16 U/L (ref 0–44)
AST: 22 U/L (ref 15–41)
Albumin: 2.5 g/dL — ABNORMAL LOW (ref 3.5–5.0)
Alkaline Phosphatase: 67 U/L (ref 38–126)
Anion gap: 9 (ref 5–15)
BUN: 13 mg/dL (ref 8–23)
CO2: 24 mmol/L (ref 22–32)
Calcium: 8.6 mg/dL — ABNORMAL LOW (ref 8.9–10.3)
Chloride: 98 mmol/L (ref 98–111)
Creatinine, Ser: 3.36 mg/dL — ABNORMAL HIGH (ref 0.44–1.00)
GFR, Estimated: 14 mL/min — ABNORMAL LOW (ref >60)
Glucose, Bld: 100 mg/dL — ABNORMAL HIGH (ref 70–99)
Potassium: 3.9 mmol/L (ref 3.5–5.1)
Sodium: 131 mmol/L — ABNORMAL LOW (ref 135–145)
Total Bilirubin: 0.9 mg/dL (ref 0.3–1.2)
Total Protein: 5.7 g/dL — ABNORMAL LOW (ref 6.5–8.1)

## 2020-11-06 LAB — HEPATITIS B CORE ANTIBODY, TOTAL: Hep B Core Total Ab: NONREACTIVE

## 2020-11-06 LAB — HEPATITIS B SURFACE ANTIGEN: Hepatitis B Surface Ag: NONREACTIVE

## 2020-11-06 LAB — APTT: aPTT: 38 seconds — ABNORMAL HIGH (ref 24–36)

## 2020-11-06 LAB — PROTIME-INR
INR: 1.2 (ref 0.8–1.2)
Prothrombin Time: 14.5 seconds (ref 11.4–15.2)

## 2020-11-06 LAB — MAGNESIUM: Magnesium: 1.5 mg/dL — ABNORMAL LOW (ref 1.7–2.4)

## 2020-11-06 NOTE — Consult Note (Signed)
[facility] CONSULT NOTE    Date: 11/06/2020                  Patient Name:  Angelica Wilson  MRN: [id number]  DOB: 07-11-1951  Age / Sex: 69 y.o., female         PCP: Josem Kaufmann, MD                 Service Requesting Consult:  Hospitalist                 Reason for Consult:  Acute kidney injury requiring hemodialysis            History of Present Illness: Patient is a 69 y.o. female with a PMHx of acute on chronic hypercapnic respiratory failure, pulmonary embolism, anemia of chronic kidney disease, acute kidney injury, chronic kidney disease stage IIIb baseline creatinine 1.6, sacral ulcer, hypertension, history of pulmonary embolism, pulmonary hypertension, diabetes mellitus type 2, hyperlipidemia, morbid obesity, hypertension, history of breast cancer, who was admitted to [facility] on 11/06/2020 for ongoing care.  Patient was recently admitted to [facility] in [place].  She presented to outside hospital 10/20/2020 with increasing shortness of breath.  She has known underlying history of pulmonary embolism.  Patient also found to have GI bleed recently.  She received contrast on 11/04/2020.  Creatinine now up to 3.36.  She has been started on hemodialysis.  She has a right internal jugular PermCath in place.  She has associated anemia of chronic kidney disease with hemoglobin of 8.8.   Medications: Outpatient medications: Medications Prior to Admission  Medication Sig Dispense Refill Last Dose  . Alpha-Lipoic Acid 600 MG CAPS Take by mouth daily.     Marland Kitchen ALPRAZolam (XANAX) 0.5 MG tablet Take 0.5 mg by mouth 3 (three) times daily as needed for anxiety.     Marland Kitchen amLODipine (NORVASC) 5 MG tablet Take 5 mg by mouth daily.     Marland Kitchen aspirin 81 MG tablet Take 81 mg by mouth daily.     . Cholecalciferol (VITAMIN D3) 2000 UNITS TABS Take 6,000 Units by mouth daily.      Marland Kitchen glucose blood test strip Use as instructed 150 each 4   . HUMALOG KWIKPEN 100 UNIT/ML  KiwkPen INJECT 0.15-0.21 MLS (15-21 UNITS TOTAL) INTO THE SKIN 3 (THREE) TIMES DAILY. 30 mL 2   . hydrALAZINE (APRESOLINE) 25 MG tablet Take 25 mg by mouth 2 (two) times daily as needed.     . hydrochlorothiazide (HYDRODIURIL) 12.5 MG tablet Take 12.5 mg by mouth daily.     . Insulin Glargine (LANTUS SOLOSTAR) 100 UNIT/ML Solostar Pen Inject 70 Units into the skin at bedtime. 30 mL 2   . insulin lispro (HUMALOG KWIKPEN) 100 UNIT/ML KiwkPen Inject 18-24 units TIDAC 30 mL 2   . Insulin Pen Needle 31G X 4 MM MISC 1 each by Does not apply route 4 (four) times daily. Use 4 x daily 150 each 5   . l-methylfolate-B6-B12 (METANX) 3-35-2 MG TABS Take 1 tablet by mouth 2 (two) times daily.     . Lactobacillus-Inulin ([place]) Take by mouth daily.     Marland Kitchen LANTUS SOLOSTAR 100 UNIT/ML Solostar Pen INJECT 70 UNITS INTO THE SKIN AT BEDTIME. 30 mL 2   . metFORMIN (GLUCOPHAGE) 500 MG tablet TAKE 1 TABLET BY MOUTH TWICE A DAY 60 tablet 2   . rivaroxaban (XARELTO) 20 MG TABS tablet Take 20 mg by mouth  daily with supper.     . valsartan-hydrochlorothiazide (DIOVAN-HCT) 320-12.5 MG per tablet Take 1 tablet by mouth daily.     . vitamin C (ASCORBIC ACID) 500 MG tablet Take 500 mg by mouth 3 (three) times daily.       Current medications: Crestor 10 mg daily, Lexapro 10 mg daily, torsemide 40 mg daily, pantoprazole 40 mg twice daily, cholecalciferol 5000 mg daily, vitamin C 1500 mg daily, Epogen 20,000 units subcutaneous Monday and Friday, alprazolam 0.5 mg every 8 hours, hydralazine 50 mg 3 times daily, amlodipine 10 mg daily, sliding scale insulin, apixaban 5 mg twice daily   Allergies: No Known Allergies    Past Medical History: Past Medical History:  Diagnosis Date  . Atrial fibrillation ([place])   . Cardiac murmur   . Diabetes mellitus without complication ([place])   . Hernia, umbilical   . Hx of blood clots   . Hyperlipidemia   . Hypertension   . Ulcer of left foot ([place])   . Uterine  cancer ([place])   Morbid obesity, history of pulmonary embolism, acute kidney injury, chronic kidney disease stage IIIb baseline creatinine 1.6, cardiorenal syndrome   Past Surgical History: Past Surgical History:  Procedure Laterality Date  . BREAST LUMPECTOMY    . TOTAL ABDOMINAL HYSTERECTOMY       Family History: Family History  Problem Relation Age of Onset  . Stroke Mother   . Heart attack Father      Social History: Social History   Socioeconomic History  . Marital status: Unknown    Spouse name: Not on file  . Number of children: Not on file  . Years of education: Not on file  . Highest education level: Not on file  Occupational History  . Not on file  Tobacco Use  . Smoking status: Never Smoker  Substance and Sexual Activity  . Alcohol use: No  . Drug use: No  . Sexual activity: Not on file  Other Topics Concern  . Not on file  Social History Narrative  . Not on file   Social Determinants of Health   Financial Resource Strain:   . Difficulty of Paying Living Expenses: Not on file  Food Insecurity:   . Worried About Charity fundraiser in the Last Year: Not on file  . Ran Out of Food in the Last Year: Not on file  Transportation Needs:   . Lack of Transportation (Medical): Not on file  . Lack of Transportation (Non-Medical): Not on file  Physical Activity:   . Days of Exercise per Week: Not on file  . Minutes of Exercise per Session: Not on file  Stress:   . Feeling of Stress : Not on file  Social Connections:   . Frequency of Communication with Friends and Family: Not on file  . Frequency of Social Gatherings with Friends and Family: Not on file  . Attends Religious Services: Not on file  . Active Member of Clubs or Organizations: Not on file  . Attends Archivist Meetings: Not on file  . Marital Status: Not on file  Intimate Partner Violence:   . Fear of Current or Ex-Partner: Not on file  . Emotionally Abused: Not on file  .  Physically Abused: Not on file  . Sexually Abused: Not on file     Review of Systems: Review of Systems  Constitutional: Positive for malaise/fatigue. Negative for chills and fever.  HENT: Negative for congestion, hearing loss and tinnitus.   Eyes:  Negative for blurred vision and double vision.  Respiratory: Positive for shortness of breath. Negative for cough and sputum production.   Cardiovascular: Negative for chest pain, palpitations and orthopnea.  Gastrointestinal: Negative for diarrhea, nausea and vomiting.  Genitourinary: Negative for dysuria, frequency and urgency.  Musculoskeletal: Negative for myalgias.  Skin: Negative for itching and rash.  Neurological: Positive for weakness. Negative for dizziness and focal weakness.  Endo/Heme/Allergies: Negative for polydipsia. Does not bruise/bleed easily.  Psychiatric/Behavioral: The patient is not nervous/anxious.      Vital Signs: Temperature 98 pulse 65 respiration 16 blood pressure 146/70  Weight trends: There were no vitals filed for this visit.  Physical Exam: Physical Exam: General:  No acute distress  Head:  Normocephalic, atraumatic. Moist oral mucosal membranes  Eyes:  Anicteric  Neck:  Supple  Lungs:   Clear to auscultation, normal effort  Heart:  Q7Y1 2/6 systolic ejection murmur  Abdomen:   Soft, nontender, bowel sounds present  Extremities:  1+ peripheral edema.  Neurologic:  Awake, alert, following commands  Skin:  No lesions  Access:  Right internal jugular PermCath    Lab results: Basic Metabolic Panel: Recent Labs  Lab 11/06/20 0506  NA 131*  K 3.9  CL 98  CO2 24  GLUCOSE 100*  BUN 13  CREATININE 3.36*  CALCIUM 8.6*  MG 1.5*  PHOS 4.1    Liver Function Tests: Recent Labs  Lab 11/06/20 0506  AST 22  ALT 16  ALKPHOS 67  BILITOT 0.9  PROT 5.7*  ALBUMIN 2.5*   No results for input(s): LIPASE, AMYLASE in the last 168 hours. No results for input(s): AMMONIA in the last 168  hours.  CBC: Recent Labs  Lab 11/06/20 0506  WBC 4.6  NEUTROABS 3.1  HGB 8.8*  HCT 27.6*  MCV 89.9  PLT 222    Cardiac Enzymes: No results for input(s): CKTOTAL, CKMB, CKMBINDEX, TROPONINI in the last 168 hours.  BNP: Invalid input(s): POCBNP  CBG: No results for input(s): GLUCAP in the last 168 hours.  Microbiology: No results found for this or any previous visit.  Coagulation Studies: Recent Labs    11/06/20 0506  LABPROT 14.5  INR 1.2    Urinalysis: No results for input(s): COLORURINE, LABSPEC, PHURINE, GLUCOSEU, HGBUR, BILIRUBINUR, KETONESUR, PROTEINUR, UROBILINOGEN, NITRITE, LEUKOCYTESUR in the last 72 hours.  Invalid input(s): APPERANCEUR    Imaging: DG Chest 1 View  Result Date: 11/06/2020 CLINICAL DATA:  Respiratory failure. EXAM: CHEST  1 VIEW COMPARISON:  None. FINDINGS: The heart is mildly enlarged. There is tortuosity and calcification of the thoracic aorta. A right IJ central venous catheter is noted. Mild central vascular congestion but no pulmonary edema. Left lower lobe process appears to be a combination effusion and atelectasis or infiltrate. Streaky right basilar atelectasis is also noted. The bony thorax is intact. IMPRESSION: 1. Mild central vascular congestion but no pulmonary edema. 2. Left lower lobe process, likely pleural effusion and atelectasis or infiltrate. Electronically Signed   By: Marijo Sanes M.D.   On: 11/06/2020 06:08      Assessment & Plan: Pt is a 69 y.o. female with a PMHx of acute on chronic hypercapnic respiratory failure, pulmonary embolism, anemia of chronic kidney disease, acute kidney injury, chronic kidney disease stage IIIb baseline creatinine 1.6, sacral ulcer, hypertension, history of pulmonary embolism, pulmonary hypertension, diabetes mellitus type 2, hyperlipidemia, morbid obesity, hypertension, history of breast cancer, who was admitted to [facility] on 11/06/2020 for ongoing care.  1.  Acute  kidney  injury/chronic kidney disease stage IIIb baseline creatinine 1.6/cardiorenal syndrome.  Creatinine trending up off of dialysis.  She has a right internal jugular PermCath in place.  Therefore we will plan for hemodialysis treatment today with ultrafiltration target of 1.5 to 2 kg.  Unclear as to whether we will be able to wean her off of hemodialysis treatment.  2.  Anemia of chronic kidney disease.  Hemoglobin currently 8.8.  Start the patient on Retacrit 6000 units IV with dialysis treatments.  3.  Secondary hyperparathyroidism.  Continue to monitor bone metabolism parameters during her hospitalization.  No immediate need for phosphorus binders.  4.  Hyponatremia.  Should improve with ongoing dialysis treatments.  5.  Thanks for consultation.

## 2020-11-07 LAB — URINALYSIS, ROUTINE W REFLEX MICROSCOPIC
Bilirubin Urine: NEGATIVE
Glucose, UA: NEGATIVE mg/dL
Hgb urine dipstick: NEGATIVE
Ketones, ur: NEGATIVE mg/dL
Nitrite: NEGATIVE
Protein, ur: NEGATIVE mg/dL
Specific Gravity, Urine: 1.02 (ref 1.005–1.030)
pH: 5 (ref 5.0–8.0)

## 2020-11-07 LAB — MAGNESIUM: Magnesium: 1.8 mg/dL (ref 1.7–2.4)

## 2020-11-07 LAB — HEPATITIS B SURFACE ANTIBODY, QUANTITATIVE: Hep B S AB Quant (Post): 12.2 m[IU]/mL (ref >9.9)

## 2020-11-08 LAB — RENAL FUNCTION PANEL
Albumin: 2.4 g/dL — ABNORMAL LOW (ref 3.5–5.0)
Anion gap: 11 (ref 5–15)
BUN: 12 mg/dL (ref 8–23)
CO2: 24 mmol/L (ref 22–32)
Calcium: 8.4 mg/dL — ABNORMAL LOW (ref 8.9–10.3)
Chloride: 97 mmol/L — ABNORMAL LOW (ref 98–111)
Creatinine, Ser: 2.57 mg/dL — ABNORMAL HIGH (ref 0.44–1.00)
GFR, Estimated: 20 mL/min — ABNORMAL LOW (ref >60)
Glucose, Bld: 115 mg/dL — ABNORMAL HIGH (ref 70–99)
Phosphorus: 3.7 mg/dL (ref 2.5–4.6)
Potassium: 3.6 mmol/L (ref 3.5–5.1)
Sodium: 132 mmol/L — ABNORMAL LOW (ref 135–145)

## 2020-11-08 LAB — CBC
HCT: 28.5 % — ABNORMAL LOW (ref 36.0–46.0)
Hemoglobin: 8.7 g/dL — ABNORMAL LOW (ref 12.0–15.0)
MCH: 27.9 pg (ref 26.0–34.0)
MCHC: 30.5 g/dL (ref 30.0–36.0)
MCV: 91.3 fL (ref 80.0–100.0)
Platelets: 209 10*3/uL (ref 150–400)
RBC: 3.12 MIL/uL — ABNORMAL LOW (ref 3.87–5.11)
RDW: 15.9 % — ABNORMAL HIGH (ref 11.5–15.5)
WBC: 4.4 10*3/uL (ref 4.0–10.5)
nRBC: 0 % (ref 0.0–0.2)

## 2020-11-08 LAB — HEMOGLOBIN A1C
Hgb A1c MFr Bld: 4.6 % — ABNORMAL LOW (ref 4.8–5.6)
Mean Plasma Glucose: 85.32 mg/dL

## 2020-11-08 NOTE — Progress Notes (Signed)
[facility]  ROUNDING NOTE   Subjective:  Patient seen and evaluated during hemodialysis treatment this AM. Tolerating well. Ultrafiltration target 2 kg today.   Objective:  Vital signs in last 24 hours:  Temperature 97.1 pulse 60 respirations 31 blood pressure 136/51  Physical Exam: General:  No acute distress  Head:  Normocephalic, atraumatic. Moist oral mucosal membranes  Eyes:  Anicteric  Neck:  Supple  Lungs:   Clear to auscultation, normal effort  Heart:  C6C3 2/6 systolic ejection murmur  Abdomen:   Soft, nontender, bowel sounds present  Extremities:  1+ peripheral edema.  Neurologic:  Awake, alert, following commands  Skin:  No lesions  Access:  Right IJ PermCath    Basic Metabolic Panel: Recent Labs  Lab 11/06/20 0506 11/06/20 1045 11/07/20 1137 11/08/20 0446  NA 131* 127*  --  132*  K 3.9 4.0  --  3.6  CL 98 94*  --  97*  CO2 24 23  --  24  GLUCOSE 100* 126*  --  115*  BUN 13 12  --  12  CREATININE 3.36* 3.26*  --  2.57*  CALCIUM 8.6* 8.5*  --  8.4*  MG 1.5*  --  1.8  --   PHOS 4.1 4.4  --  3.7    Liver Function Tests: Recent Labs  Lab 11/06/20 0506 11/06/20 1045 11/08/20 0446  AST 22  --   --   ALT 16  --   --   ALKPHOS 67  --   --   BILITOT 0.9  --   --   PROT 5.7*  --   --   ALBUMIN 2.5* 2.6* 2.4*   No results for input(s): LIPASE, AMYLASE in the last 168 hours. No results for input(s): AMMONIA in the last 168 hours.  CBC: Recent Labs  Lab 11/06/20 0506 11/06/20 1045 11/08/20 0446  WBC 4.6 4.6 4.4  NEUTROABS 3.1  --   --   HGB 8.8* 9.5* 8.7*  HCT 27.6* 30.0* 28.5*  MCV 89.9 90.4 91.3  PLT 222 228 209    Cardiac Enzymes: No results for input(s): CKTOTAL, CKMB, CKMBINDEX, TROPONINI in the last 168 hours.  BNP: Invalid input(s): POCBNP  CBG: No results for input(s): GLUCAP in the last 168 hours.  Microbiology: No results found for this or any previous visit.  Coagulation Studies: Recent Labs     11/06/20 0506  LABPROT 14.5  INR 1.2    Urinalysis: Recent Labs    11/07/20 1617  COLORURINE AMBER*  LABSPEC 1.020  PHURINE 5.0  GLUCOSEU NEGATIVE  HGBUR NEGATIVE  BILIRUBINUR NEGATIVE  KETONESUR NEGATIVE  PROTEINUR NEGATIVE  NITRITE NEGATIVE  LEUKOCYTESUR MODERATE*      Imaging: No results found.   Medications:       Assessment/ Plan:  69 y.o. female with a PMHx of acute on chronic hypercapnic respiratory failure, pulmonary embolism, anemia of chronic kidney disease, acute kidney injury, chronic kidney disease stage IIIb baseline creatinine 1.6, sacral ulcer, hypertension, history of pulmonary embolism, pulmonary hypertension, diabetes mellitus type 2, hyperlipidemia, morbid obesity, hypertension, history of breast cancer, who was admitted to [facility] on 11/06/2020 for ongoing care.  1.  Acute kidney injury/chronic kidney disease stage IIIb baseline creatinine 1.6/cardiorenal syndrome.  Patient seen and evaluated during hemodialysis treatment today.  Tolerating well.  Ultrafiltration target 2 kg.  Next dialysis treatment on Monday.  2.  Anemia of chronic kidney disease.  Hemoglobin currently 8.7.  Patient started on Retacrit 6000 units  IV with dialysis.  3.  Secondary hyperparathyroidism.  Serum phosphorus 3.7 at last check.  Currently acceptable.  Continue to monitor.  4.  Hyponatremia.  Serum sodium up to 132.  Should continue to improve with ongoing dialysis sessions.   LOS: 0 Ladasha Schnackenberg 12/10/20218:40 AM

## 2020-11-10 LAB — URINE CULTURE: Culture: >=100000 — AB

## 2020-11-11 LAB — RENAL FUNCTION PANEL
Albumin: 2.5 g/dL — ABNORMAL LOW (ref 3.5–5.0)
Anion gap: 10 (ref 5–15)
BUN: 33 mg/dL — ABNORMAL HIGH (ref 8–23)
CO2: 25 mmol/L (ref 22–32)
Calcium: 8.8 mg/dL — ABNORMAL LOW (ref 8.9–10.3)
Chloride: 95 mmol/L — ABNORMAL LOW (ref 98–111)
Creatinine, Ser: 2.36 mg/dL — ABNORMAL HIGH (ref 0.44–1.00)
GFR, Estimated: 22 mL/min — ABNORMAL LOW (ref >60)
Glucose, Bld: 109 mg/dL — ABNORMAL HIGH (ref 70–99)
Phosphorus: 3.5 mg/dL (ref 2.5–4.6)
Potassium: 4.1 mmol/L (ref 3.5–5.1)
Sodium: 130 mmol/L — ABNORMAL LOW (ref 135–145)

## 2020-11-11 LAB — CBC
HCT: 27.9 % — ABNORMAL LOW (ref 36.0–46.0)
Hemoglobin: 8.9 g/dL — ABNORMAL LOW (ref 12.0–15.0)
MCH: 28.7 pg (ref 26.0–34.0)
MCHC: 31.9 g/dL (ref 30.0–36.0)
MCV: 90 fL (ref 80.0–100.0)
Platelets: 246 10*3/uL (ref 150–400)
RBC: 3.1 MIL/uL — ABNORMAL LOW (ref 3.87–5.11)
RDW: 15.5 % (ref 11.5–15.5)
WBC: 4.4 10*3/uL (ref 4.0–10.5)
nRBC: 0 % (ref 0.0–0.2)

## 2020-11-11 NOTE — Progress Notes (Signed)
[facility]  ROUNDING NOTE   Subjective:  Overall it appears that renal function is improving. Creatinine down to 2.57. Urine output was 1.6 L over the preceding 24 hours.   Objective:  Vital signs in last 24 hours:  Temperature 97.4 pulse 58 respirations 40 blood pressure 142/64  Physical Exam: General:  No acute distress  Head:  Normocephalic, atraumatic. Moist oral mucosal membranes  Eyes:  Anicteric  Neck:  Supple  Lungs:   Clear to auscultation, normal effort  Heart:  W2N5 2/6 systolic ejection murmur  Abdomen:   Soft, nontender, bowel sounds present  Extremities:  Trace peripheral edema.  Neurologic:  Awake, alert, following commands  Skin:  No lesions  Access:  Right IJ PermCath    Basic Metabolic Panel: Recent Labs  Lab 11/06/20 0506 11/06/20 1045 11/07/20 1137 11/08/20 0446  NA 131* 127*  --  132*  K 3.9 4.0  --  3.6  CL 98 94*  --  97*  CO2 24 23  --  24  GLUCOSE 100* 126*  --  115*  BUN 13 12  --  12  CREATININE 3.36* 3.26*  --  2.57*  CALCIUM 8.6* 8.5*  --  8.4*  MG 1.5*  --  1.8  --   PHOS 4.1 4.4  --  3.7    Liver Function Tests: Recent Labs  Lab 11/06/20 0506 11/06/20 1045 11/08/20 0446  AST 22  --   --   ALT 16  --   --   ALKPHOS 67  --   --   BILITOT 0.9  --   --   PROT 5.7*  --   --   ALBUMIN 2.5* 2.6* 2.4*   No results for input(s): LIPASE, AMYLASE in the last 168 hours. No results for input(s): AMMONIA in the last 168 hours.  CBC: Recent Labs  Lab 11/06/20 0506 11/06/20 1045 11/08/20 0446  WBC 4.6 4.6 4.4  NEUTROABS 3.1  --   --   HGB 8.8* 9.5* 8.7*  HCT 27.6* 30.0* 28.5*  MCV 89.9 90.4 91.3  PLT 222 228 209    Cardiac Enzymes: No results for input(s): CKTOTAL, CKMB, CKMBINDEX, TROPONINI in the last 168 hours.  BNP: Invalid input(s): POCBNP  CBG: No results for input(s): GLUCAP in the last 168 hours.  Microbiology: Results for orders placed or performed during the hospital encounter of 11/06/20   Culture, Urine     Status: Abnormal   Collection Time: 11/07/20  4:17 PM   Specimen: Urine, Random  Result Value Ref Range Status   Specimen Description URINE, RANDOM  Final   Special Requests   Final    NONE Performed at [facility], [place] [address] [place], [place] [address]    Culture (A)  Final    >=100,000 COLONIES/mL CITROBACTER KOSERI 30,000 COLONIES/mL PROTEUS MIRABILIS    Report Status 11/10/2020 FINAL  Final   Organism ID, Bacteria CITROBACTER KOSERI (A)  Final   Organism ID, Bacteria PROTEUS MIRABILIS (A)  Final      Susceptibility   Citrobacter koseri - MIC*    CEFAZOLIN <=4 SENSITIVE Sensitive     CEFEPIME <=0.12 SENSITIVE Sensitive     CEFTRIAXONE <=0.25 SENSITIVE Sensitive     CIPROFLOXACIN <=0.25 SENSITIVE Sensitive     GENTAMICIN <=1 SENSITIVE Sensitive     IMIPENEM 2 SENSITIVE Sensitive     NITROFURANTOIN 128 RESISTANT Resistant     TRIMETH/SULFA <=20 SENSITIVE Sensitive     PIP/TAZO <=4  SENSITIVE Sensitive     * >=100,000 COLONIES/mL CITROBACTER KOSERI   Proteus mirabilis - MIC*    AMPICILLIN >=32 RESISTANT Resistant     CEFAZOLIN <=4 SENSITIVE Sensitive     CEFEPIME <=0.12 SENSITIVE Sensitive     CEFTRIAXONE <=0.25 SENSITIVE Sensitive     CIPROFLOXACIN <=0.25 SENSITIVE Sensitive     GENTAMICIN <=1 SENSITIVE Sensitive     IMIPENEM <=0.25 SENSITIVE Sensitive     NITROFURANTOIN RESISTANT Resistant     TRIMETH/SULFA <=20 SENSITIVE Sensitive     AMPICILLIN/SULBACTAM 8 SENSITIVE Sensitive     PIP/TAZO 16 SENSITIVE Sensitive     * 30,000 COLONIES/mL PROTEUS MIRABILIS    Coagulation Studies: No results for input(s): LABPROT, INR in the last 72 hours.  Urinalysis: No results for input(s): COLORURINE, LABSPEC, PHURINE, GLUCOSEU, HGBUR, BILIRUBINUR, KETONESUR, PROTEINUR, UROBILINOGEN, NITRITE, LEUKOCYTESUR in the last 72 hours.  Invalid input(s): APPERANCEUR    Imaging: No results found.   Medications:       Assessment/ Plan:  69 y.o.  female with a PMHx of acute on chronic hypercapnic respiratory failure, pulmonary embolism, anemia of chronic kidney disease, acute kidney injury, chronic kidney disease stage IIIb baseline creatinine 1.6, sacral ulcer, hypertension, history of pulmonary embolism, pulmonary hypertension, diabetes mellitus type 2, hyperlipidemia, morbid obesity, hypertension, history of breast cancer, who was admitted to [facility] on 11/06/2020 for ongoing care.  1.  Acute kidney injury/chronic kidney disease stage IIIb baseline creatinine 1.6/cardiorenal syndrome.  Creatinine trending down.  Currently 2.57 with an EGFR 20.  Urine output was 1.6 L over the preceding 24 hours.  Therefore we will hold further dialysis sessions.  2.  Anemia of chronic kidney disease.  Hemoglobin 8.7.  Was on Retacrit.  Continue to trend hemoglobin.  3.  Secondary hyperparathyroidism.  Continue periodically monitor abdominal metabolism parameters.  4.  Hyponatremia.  Continue to periodically monitor serum sodium.   LOS: 0 Ivy Puryear 12/13/20218:12 AM

## 2020-11-13 LAB — CBC
HCT: 28 % — ABNORMAL LOW (ref 36.0–46.0)
Hemoglobin: 9.2 g/dL — ABNORMAL LOW (ref 12.0–15.0)
MCH: 28.8 pg (ref 26.0–34.0)
MCHC: 32.9 g/dL (ref 30.0–36.0)
MCV: 87.5 fL (ref 80.0–100.0)
Platelets: 249 10*3/uL (ref 150–400)
RBC: 3.2 MIL/uL — ABNORMAL LOW (ref 3.87–5.11)
RDW: 15.4 % (ref 11.5–15.5)
WBC: 4.2 10*3/uL (ref 4.0–10.5)
nRBC: 0 % (ref 0.0–0.2)

## 2020-11-13 LAB — RENAL FUNCTION PANEL
Albumin: 2.5 g/dL — ABNORMAL LOW (ref 3.5–5.0)
Anion gap: 11 (ref 5–15)
BUN: 38 mg/dL — ABNORMAL HIGH (ref 8–23)
CO2: 24 mmol/L (ref 22–32)
Calcium: 8.8 mg/dL — ABNORMAL LOW (ref 8.9–10.3)
Chloride: 96 mmol/L — ABNORMAL LOW (ref 98–111)
Creatinine, Ser: 2.31 mg/dL — ABNORMAL HIGH (ref 0.44–1.00)
GFR, Estimated: 22 mL/min — ABNORMAL LOW (ref >60)
Glucose, Bld: 116 mg/dL — ABNORMAL HIGH (ref 70–99)
Phosphorus: 4.4 mg/dL (ref 2.5–4.6)
Potassium: 3.6 mmol/L (ref 3.5–5.1)
Sodium: 131 mmol/L — ABNORMAL LOW (ref 135–145)

## 2020-11-13 NOTE — Progress Notes (Signed)
[facility]  ROUNDING NOTE   Subjective:  Renal function does appear to have stabilized. Creatinine currently 2.31 with an EGFR of 22. Good urine output of 1.7 L over the preceding 24 hours.   Objective:  Vital signs in last 24 hours:  Temperature 97.6 pulse 50 respirations 26 blood pressure 149/61  Physical Exam: General:  No acute distress  Head:  Normocephalic, atraumatic. Moist oral mucosal membranes  Eyes:  Anicteric  Neck:  Supple  Lungs:   Clear to auscultation, normal effort  Heart:  Y6E1 2/6 systolic ejection murmur  Abdomen:   Soft, nontender, bowel sounds present  Extremities:  Trace peripheral edema.  Neurologic:  Awake, alert, following commands  Skin:  No lesions  Access:  Right IJ PermCath    Basic Metabolic Panel: Recent Labs  Lab 11/06/20 1045 11/07/20 1137 11/08/20 0446 11/11/20 0655 11/13/20 0518  NA 127*  --  132* 130* 131*  K 4.0  --  3.6 4.1 3.6  CL 94*  --  97* 95* 96*  CO2 23  --  24 25 24   GLUCOSE 126*  --  115* 109* 116*  BUN 12  --  12 33* 38*  CREATININE 3.26*  --  2.57* 2.36* 2.31*  CALCIUM 8.5*  --  8.4* 8.8* 8.8*  MG  --  1.8  --   --   --   PHOS 4.4  --  3.7 3.5 4.4    Liver Function Tests: Recent Labs  Lab 11/06/20 1045 11/08/20 0446 11/11/20 0655 11/13/20 0518  ALBUMIN 2.6* 2.4* 2.5* 2.5*   No results for input(s): LIPASE, AMYLASE in the last 168 hours. No results for input(s): AMMONIA in the last 168 hours.  CBC: Recent Labs  Lab 11/06/20 1045 11/08/20 0446 11/11/20 0655 11/13/20 0518  WBC 4.6 4.4 4.4 4.2  HGB 9.5* 8.7* 8.9* 9.2*  HCT 30.0* 28.5* 27.9* 28.0*  MCV 90.4 91.3 90.0 87.5  PLT 228 209 246 249    Cardiac Enzymes: No results for input(s): CKTOTAL, CKMB, CKMBINDEX, TROPONINI in the last 168 hours.  BNP: Invalid input(s): POCBNP  CBG: No results for input(s): GLUCAP in the last 168 hours.  Microbiology: Results for orders placed or performed during the hospital encounter of  11/06/20  Culture, Urine     Status: Abnormal   Collection Time: 11/07/20  4:17 PM   Specimen: Urine, Random  Result Value Ref Range Status   Specimen Description URINE, RANDOM  Final   Special Requests   Final    NONE Performed at [facility], [place] [address], [place],  [address]    Culture (A)  Final    >=100,000 COLONIES/mL CITROBACTER KOSERI 30,000 COLONIES/mL PROTEUS MIRABILIS    Report Status 11/10/2020 FINAL  Final   Organism ID, Bacteria CITROBACTER KOSERI (A)  Final   Organism ID, Bacteria PROTEUS MIRABILIS (A)  Final      Susceptibility   Citrobacter koseri - MIC*    CEFAZOLIN <=4 SENSITIVE Sensitive     CEFEPIME <=0.12 SENSITIVE Sensitive     CEFTRIAXONE <=0.25 SENSITIVE Sensitive     CIPROFLOXACIN <=0.25 SENSITIVE Sensitive     GENTAMICIN <=1 SENSITIVE Sensitive     IMIPENEM 2 SENSITIVE Sensitive     NITROFURANTOIN 128 RESISTANT Resistant     TRIMETH/SULFA <=20 SENSITIVE Sensitive     PIP/TAZO <=4 SENSITIVE Sensitive     * >=100,000 COLONIES/mL CITROBACTER KOSERI   Proteus mirabilis - MIC*    AMPICILLIN >=32 RESISTANT Resistant  CEFAZOLIN <=4 SENSITIVE Sensitive     CEFEPIME <=0.12 SENSITIVE Sensitive     CEFTRIAXONE <=0.25 SENSITIVE Sensitive     CIPROFLOXACIN <=0.25 SENSITIVE Sensitive     GENTAMICIN <=1 SENSITIVE Sensitive     IMIPENEM <=0.25 SENSITIVE Sensitive     NITROFURANTOIN RESISTANT Resistant     TRIMETH/SULFA <=20 SENSITIVE Sensitive     AMPICILLIN/SULBACTAM 8 SENSITIVE Sensitive     PIP/TAZO 16 SENSITIVE Sensitive     * 30,000 COLONIES/mL PROTEUS MIRABILIS    Coagulation Studies: No results for input(s): LABPROT, INR in the last 72 hours.  Urinalysis: No results for input(s): COLORURINE, LABSPEC, PHURINE, GLUCOSEU, HGBUR, BILIRUBINUR, KETONESUR, PROTEINUR, UROBILINOGEN, NITRITE, LEUKOCYTESUR in the last 72 hours.  Invalid input(s): APPERANCEUR    Imaging: No results found.   Medications:       Assessment/ Plan:   69 y.o. female with a PMHx of acute on chronic hypercapnic respiratory failure, pulmonary embolism, anemia of chronic kidney disease, acute kidney injury, chronic kidney disease stage IIIb baseline creatinine 1.6, sacral ulcer, hypertension, history of pulmonary embolism, pulmonary hypertension, diabetes mellitus type 2, hyperlipidemia, morbid obesity, hypertension, history of breast cancer, who was admitted to [facility] on 11/06/2020 for ongoing care.  1.  Acute kidney injury/chronic kidney disease stage IIIb baseline creatinine 1.6/cardiorenal syndrome.  Continue to hold dialysis treatments for now.  Creatinine 2.31 with an EGFR 22.  Good urine output at 1.7 L over the last 24 hours.  2.  Anemia of chronic kidney disease.  Hemoglobin improved to 9.2.  No immediate need for Epogen at the moment.  3.  Secondary hyperparathyroidism.  Phosphorus slightly high at 4.4.  No need for binders at the moment.  Monitor calcium, phosphorus.  4.  Hyponatremia.  Sodium 131 at the moment.  Relatively stable.  Continue to monitor.   LOS: 0 Angelica Wilson 12/15/20218:07 AM

## 2020-11-14 ENCOUNTER — Other Ambulatory Visit (HOSPITAL_COMMUNITY): Payer: BLUE CROSS/BLUE SHIELD

## 2020-11-15 ENCOUNTER — Other Ambulatory Visit (HOSPITAL_COMMUNITY): Payer: BLUE CROSS/BLUE SHIELD

## 2020-11-15 LAB — RENAL FUNCTION PANEL
Albumin: 2.5 g/dL — ABNORMAL LOW (ref 3.5–5.0)
Anion gap: 11 (ref 5–15)
BUN: 43 mg/dL — ABNORMAL HIGH (ref 8–23)
CO2: 27 mmol/L (ref 22–32)
Calcium: 9.2 mg/dL (ref 8.9–10.3)
Chloride: 95 mmol/L — ABNORMAL LOW (ref 98–111)
Creatinine, Ser: 2.32 mg/dL — ABNORMAL HIGH (ref 0.44–1.00)
GFR, Estimated: 22 mL/min — ABNORMAL LOW (ref >60)
Glucose, Bld: 115 mg/dL — ABNORMAL HIGH (ref 70–99)
Phosphorus: 4.3 mg/dL (ref 2.5–4.6)
Potassium: 3.7 mmol/L (ref 3.5–5.1)
Sodium: 133 mmol/L — ABNORMAL LOW (ref 135–145)

## 2020-11-15 LAB — CBC
HCT: 28 % — ABNORMAL LOW (ref 36.0–46.0)
Hemoglobin: 9.1 g/dL — ABNORMAL LOW (ref 12.0–15.0)
MCH: 28.6 pg (ref 26.0–34.0)
MCHC: 32.5 g/dL (ref 30.0–36.0)
MCV: 88.1 fL (ref 80.0–100.0)
Platelets: 248 10*3/uL (ref 150–400)
RBC: 3.18 MIL/uL — ABNORMAL LOW (ref 3.87–5.11)
RDW: 15.3 % (ref 11.5–15.5)
WBC: 4.9 10*3/uL (ref 4.0–10.5)
nRBC: 0 % (ref 0.0–0.2)

## 2020-11-15 MED ORDER — LIDOCAINE HCL 1 % IJ SOLN
INTRAMUSCULAR | Status: AC
  Filled 2020-11-15: qty 20

## 2020-11-15 NOTE — Progress Notes (Signed)
[facility]  ROUNDING NOTE   Subjective:  Patient seen and evaluated at bedside. Creatinine currently 2.3. Urine output was 1.2 L over the preceding 24 hours.   Objective:  Vital signs in last 24 hours:  Temperature 97.6 pulse 50 respirations 26 blood pressure 149/61  Physical Exam: General:  No acute distress  Head:  Normocephalic, atraumatic. Moist oral mucosal membranes  Eyes:  Anicteric  Neck:  Supple  Lungs:   Clear to auscultation, normal effort  Heart:  I4P3 2/6 systolic ejection murmur  Abdomen:   Soft, nontender, bowel sounds present  Extremities:  Trace peripheral edema.  Neurologic:  Awake, alert, following commands  Skin:  No lesions  Access:  Right IJ PermCath    Basic Metabolic Panel: Recent Labs  Lab 11/11/20 0655 11/13/20 0518 11/15/20 0435  NA 130* 131* 133*  K 4.1 3.6 3.7  CL 95* 96* 95*  CO2 25 24 27   GLUCOSE 109* 116* 115*  BUN 33* 38* 43*  CREATININE 2.36* 2.31* 2.32*  CALCIUM 8.8* 8.8* 9.2  PHOS 3.5 4.4 4.3    Liver Function Tests: Recent Labs  Lab 11/11/20 0655 11/13/20 0518 11/15/20 0435  ALBUMIN 2.5* 2.5* 2.5*   No results for input(s): LIPASE, AMYLASE in the last 168 hours. No results for input(s): AMMONIA in the last 168 hours.  CBC: Recent Labs  Lab 11/11/20 0655 11/13/20 0518 11/15/20 0435  WBC 4.4 4.2 4.9  HGB 8.9* 9.2* 9.1*  HCT 27.9* 28.0* 28.0*  MCV 90.0 87.5 88.1  PLT 246 249 248    Cardiac Enzymes: No results for input(s): CKTOTAL, CKMB, CKMBINDEX, TROPONINI in the last 168 hours.  BNP: Invalid input(s): POCBNP  CBG: No results for input(s): GLUCAP in the last 168 hours.  Microbiology: Results for orders placed or performed during the hospital encounter of 11/06/20  Culture, Urine     Status: Abnormal   Collection Time: 11/07/20  4:17 PM   Specimen: Urine, Random  Result Value Ref Range Status   Specimen Description URINE, RANDOM  Final   Special Requests   Final    NONE Performed at  [facility], [place] [address], [place], [place] [address]    Culture (A)  Final    >=100,000 COLONIES/mL CITROBACTER KOSERI 30,000 COLONIES/mL PROTEUS MIRABILIS    Report Status 11/10/2020 FINAL  Final   Organism ID, Bacteria CITROBACTER KOSERI (A)  Final   Organism ID, Bacteria PROTEUS MIRABILIS (A)  Final      Susceptibility   Citrobacter koseri - MIC*    CEFAZOLIN <=4 SENSITIVE Sensitive     CEFEPIME <=0.12 SENSITIVE Sensitive     CEFTRIAXONE <=0.25 SENSITIVE Sensitive     CIPROFLOXACIN <=0.25 SENSITIVE Sensitive     GENTAMICIN <=1 SENSITIVE Sensitive     IMIPENEM 2 SENSITIVE Sensitive     NITROFURANTOIN 128 RESISTANT Resistant     TRIMETH/SULFA <=20 SENSITIVE Sensitive     PIP/TAZO <=4 SENSITIVE Sensitive     * >=100,000 COLONIES/mL CITROBACTER KOSERI   Proteus mirabilis - MIC*    AMPICILLIN >=32 RESISTANT Resistant     CEFAZOLIN <=4 SENSITIVE Sensitive     CEFEPIME <=0.12 SENSITIVE Sensitive     CEFTRIAXONE <=0.25 SENSITIVE Sensitive     CIPROFLOXACIN <=0.25 SENSITIVE Sensitive     GENTAMICIN <=1 SENSITIVE Sensitive     IMIPENEM <=0.25 SENSITIVE Sensitive     NITROFURANTOIN RESISTANT Resistant     TRIMETH/SULFA <=20 SENSITIVE Sensitive     AMPICILLIN/SULBACTAM 8 SENSITIVE Sensitive  PIP/TAZO 16 SENSITIVE Sensitive     * 30,000 COLONIES/mL PROTEUS MIRABILIS    Coagulation Studies: No results for input(s): LABPROT, INR in the last 72 hours.  Urinalysis: No results for input(s): COLORURINE, LABSPEC, PHURINE, GLUCOSEU, HGBUR, BILIRUBINUR, KETONESUR, PROTEINUR, UROBILINOGEN, NITRITE, LEUKOCYTESUR in the last 72 hours.  Invalid input(s): APPERANCEUR    Imaging: DG Chest Port 1 View  Result Date: 11/14/2020 CLINICAL DATA:  69 year old female with congestive heart failure. EXAM: PORTABLE CHEST 1 VIEW COMPARISON:  Portable chest 11/06/2020. FINDINGS: Portable AP upright view at 0615 hours. Stable right chest dual lumen dialysis type catheter. Left PICC line  appears stable. Continued veiling and confluent opacity at the left lung base compatible with small to moderate pleural effusion. Stable cardiac size and mediastinal contours. No pneumothorax. Pulmonary vascularity appears regressed, no overt edema. Trace pleural fluid on the right. Stable visualized osseous structures. IMPRESSION: 1. Continued small to moderate left pleural effusion. Trace right pleural fluid. 2. Regressed pulmonary vascularity with no overt edema. Electronically Signed   By: Genevie Ann M.D.   On: 11/14/2020 06:41     Medications:       Assessment/ Plan:  69 y.o. female with a PMHx of acute on chronic hypercapnic respiratory failure, pulmonary embolism, anemia of chronic kidney disease, acute kidney injury, chronic kidney disease stage IIIb baseline creatinine 1.6, sacral ulcer, hypertension, history of pulmonary embolism, pulmonary hypertension, diabetes mellitus type 2, hyperlipidemia, morbid obesity, hypertension, history of breast cancer, who was admitted to [facility] on 11/06/2020 for ongoing care.  1.  Acute kidney injury/chronic kidney disease stage IIIb baseline creatinine 1.6/cardiorenal syndrome.  Renal function has stabilized, Cr down to 2.3.  UOP 1.2 over the last 24 hours.    2.  Anemia of chronic kidney disease.  Hgb currently 9.1, will monitor hgb for now.   3.  Secondary hyperparathyroidism.  Phosphorus at target 4.3, will monitor.  4.  Hyponatremia.  Sodium 133 and improved, will follow.     LOS: 0 Angelica Wilson 12/17/20218:02 AM

## 2020-11-15 NOTE — Progress Notes (Signed)
Patient presented to IR for tunneled HD catheter removal today per request for Dr. Holley Raring (nephrology). Per RN at bedside patient has not needed HD in over a week and is not planned for further HD treatments.  Patient expresses significant concern regarding HD catheter removal as she previously had an HD catheter placed at a hospital in [place] which was subsequently removed because she was told she no longer needed dialysis, however it turned out that she did require further HD and so the catheter had to be replaced which was traumatic for her. She is extremely anxious about having the catheter removed because of this prior experience and would like to discuss the need for possible further HD/catheter removal with nephrology before proceeding. She is not comfortable consenting to removal right now. We discussed the increased risk of infection with leaving catheter in place, the possibility of this also causes her anxiety however given she has expressed several questions related to her nephrology care decision was made to hold on HD catheter removal until she can speak with the nephrologist and have her questions answered to her satisfaction. HD catheter was visually assessed by myself today and is appropriately dressed without any s/s of infection.  Will plan to have patient return to IR on Monday or Tuesday of next week after she has spoken to nephrology and is agreeable to proceed with HD catheter removal.   Please call IR with questions or concerns.  Candiss Norse, PA-C

## 2020-11-18 ENCOUNTER — Other Ambulatory Visit (HOSPITAL_COMMUNITY): Payer: BLUE CROSS/BLUE SHIELD

## 2020-11-18 HISTORY — PX: IR REMOVAL TUN CV CATH W/O FL: IMG2289

## 2020-11-18 LAB — CBC
HCT: 30 % — ABNORMAL LOW (ref 36.0–46.0)
Hemoglobin: 9.1 g/dL — ABNORMAL LOW (ref 12.0–15.0)
MCH: 27.4 pg (ref 26.0–34.0)
MCHC: 30.3 g/dL (ref 30.0–36.0)
MCV: 90.4 fL (ref 80.0–100.0)
Platelets: 293 10*3/uL (ref 150–400)
RBC: 3.32 MIL/uL — ABNORMAL LOW (ref 3.87–5.11)
RDW: 15.3 % (ref 11.5–15.5)
WBC: 7.3 10*3/uL (ref 4.0–10.5)
nRBC: 0 % (ref 0.0–0.2)

## 2020-11-18 LAB — RENAL FUNCTION PANEL
Albumin: 2.7 g/dL — ABNORMAL LOW (ref 3.5–5.0)
Anion gap: 11 (ref 5–15)
BUN: 51 mg/dL — ABNORMAL HIGH (ref 8–23)
CO2: 26 mmol/L (ref 22–32)
Calcium: 9.3 mg/dL (ref 8.9–10.3)
Chloride: 97 mmol/L — ABNORMAL LOW (ref 98–111)
Creatinine, Ser: 2.38 mg/dL — ABNORMAL HIGH (ref 0.44–1.00)
GFR, Estimated: 22 mL/min — ABNORMAL LOW (ref >60)
Glucose, Bld: 107 mg/dL — ABNORMAL HIGH (ref 70–99)
Phosphorus: 4.5 mg/dL (ref 2.5–4.6)
Potassium: 4.3 mmol/L (ref 3.5–5.1)
Sodium: 134 mmol/L — ABNORMAL LOW (ref 135–145)

## 2020-11-18 MED ORDER — LIDOCAINE HCL 1 % IJ SOLN
INTRAMUSCULAR | Status: AC | PRN
  Administered 2020-11-18: 20 mL via INTRADERMAL

## 2020-11-18 MED ORDER — LIDOCAINE HCL 1 % IJ SOLN
INTRAMUSCULAR | Status: AC
  Filled 2020-11-18: qty 20

## 2020-11-18 NOTE — Progress Notes (Signed)
[facility]  ROUNDING NOTE   Subjective:  Patient reported significant anxiety about having PermCath removed. We again reassured her that this was the proper course of action given renal recovery. Patient states that she was willing to proceed though she did remain a bit anxious.   Objective:  Vital signs in last 24 hours:  Temperature 96.8 pulse 64 respirations 18 blood pressure 185/66  Physical Exam: General:  No acute distress  Head:  Normocephalic, atraumatic. Moist oral mucosal membranes  Eyes:  Anicteric  Neck:  Supple  Lungs:   Clear to auscultation, normal effort  Heart:  N4O2 2/6 systolic ejection murmur  Abdomen:   Soft, nontender, bowel sounds present  Extremities:  Trace peripheral edema.  Neurologic:  Awake, alert, following commands  Skin:  No lesions  Access:  Right IJ PermCath    Basic Metabolic Panel: Recent Labs  Lab 11/13/20 0518 11/15/20 0435 11/18/20 0544  NA 131* 133* 134*  K 3.6 3.7 4.3  CL 96* 95* 97*  CO2 24 27 26   GLUCOSE 116* 115* 107*  BUN 38* 43* 51*  CREATININE 2.31* 2.32* 2.38*  CALCIUM 8.8* 9.2 9.3  PHOS 4.4 4.3 4.5    Liver Function Tests: Recent Labs  Lab 11/13/20 0518 11/15/20 0435 11/18/20 0544  ALBUMIN 2.5* 2.5* 2.7*   No results for input(s): LIPASE, AMYLASE in the last 168 hours. No results for input(s): AMMONIA in the last 168 hours.  CBC: Recent Labs  Lab 11/13/20 0518 11/15/20 0435 11/18/20 0544  WBC 4.2 4.9 7.3  HGB 9.2* 9.1* 9.1*  HCT 28.0* 28.0* 30.0*  MCV 87.5 88.1 90.4  PLT 249 248 293    Cardiac Enzymes: No results for input(s): CKTOTAL, CKMB, CKMBINDEX, TROPONINI in the last 168 hours.  BNP: Invalid input(s): POCBNP  CBG: No results for input(s): GLUCAP in the last 168 hours.  Microbiology: Results for orders placed or performed during the hospital encounter of 11/06/20  Culture, Urine     Status: Abnormal   Collection Time: 11/07/20  4:17 PM   Specimen: Urine, Random   Result Value Ref Range Status   Specimen Description URINE, RANDOM  Final   Special Requests   Final    NONE Performed at [facility], [place] [address] [place], [place] [address]    Culture (A)  Final    >=100,000 COLONIES/mL CITROBACTER KOSERI 30,000 COLONIES/mL PROTEUS MIRABILIS    Report Status 11/10/2020 FINAL  Final   Organism ID, Bacteria CITROBACTER KOSERI (A)  Final   Organism ID, Bacteria PROTEUS MIRABILIS (A)  Final      Susceptibility   Citrobacter koseri - MIC*    CEFAZOLIN <=4 SENSITIVE Sensitive     CEFEPIME <=0.12 SENSITIVE Sensitive     CEFTRIAXONE <=0.25 SENSITIVE Sensitive     CIPROFLOXACIN <=0.25 SENSITIVE Sensitive     GENTAMICIN <=1 SENSITIVE Sensitive     IMIPENEM 2 SENSITIVE Sensitive     NITROFURANTOIN 128 RESISTANT Resistant     TRIMETH/SULFA <=20 SENSITIVE Sensitive     PIP/TAZO <=4 SENSITIVE Sensitive     * >=100,000 COLONIES/mL CITROBACTER KOSERI   Proteus mirabilis - MIC*    AMPICILLIN >=32 RESISTANT Resistant     CEFAZOLIN <=4 SENSITIVE Sensitive     CEFEPIME <=0.12 SENSITIVE Sensitive     CEFTRIAXONE <=0.25 SENSITIVE Sensitive     CIPROFLOXACIN <=0.25 SENSITIVE Sensitive     GENTAMICIN <=1 SENSITIVE Sensitive     IMIPENEM <=0.25 SENSITIVE Sensitive     NITROFURANTOIN  RESISTANT Resistant     TRIMETH/SULFA <=20 SENSITIVE Sensitive     AMPICILLIN/SULBACTAM 8 SENSITIVE Sensitive     PIP/TAZO 16 SENSITIVE Sensitive     * 30,000 COLONIES/mL PROTEUS MIRABILIS    Coagulation Studies: No results for input(s): LABPROT, INR in the last 72 hours.  Urinalysis: No results for input(s): COLORURINE, LABSPEC, PHURINE, GLUCOSEU, HGBUR, BILIRUBINUR, KETONESUR, PROTEINUR, UROBILINOGEN, NITRITE, LEUKOCYTESUR in the last 72 hours.  Invalid input(s): APPERANCEUR    Imaging: No results found.   Medications:       Assessment/ Plan:  69 y.o. female with a PMHx of acute on chronic hypercapnic respiratory failure, pulmonary embolism, anemia of  chronic kidney disease, acute kidney injury, chronic kidney disease stage IIIb baseline creatinine 1.6, sacral ulcer, hypertension, history of pulmonary embolism, pulmonary hypertension, diabetes mellitus type 2, hyperlipidemia, morbid obesity, hypertension, history of breast cancer, who was admitted to [facility] on 11/06/2020 for ongoing care.  1.  Acute kidney injury/chronic kidney disease stage IIIb baseline creatinine 1.6/cardiorenal syndrome.  Renal function remained stable with a creatinine of 2.38 with GFR of 22.  Continue to monitor renal parameters closely.  No further need for PermCath.  They have been had a long discussion with the patient today regarding PermCath removal as there is increased risk of infection leaving it in place.  She verbalized understanding and appears to be agreeable to move forward until still appears a bit anxious.  2.  Anemia of chronic kidney disease.  Hemoglobin stable at 9.1.  Continue to periodically monitor.  3.  Secondary hyperparathyroidism.  Phosphorus currently 4.5 and acceptable.  4.  Hyponatremia.  Serum sodium improved to 134.  We will continue to monitor.   LOS: 0 Lizzete Gough 12/20/20218:12 AM

## 2020-11-18 NOTE — Procedures (Signed)
Successful removal of tunneled (R)IJ HD catheter. No complications.  Ascencion Dike PA-C Interventional Radiology 11/18/2020 12:06 PM

## 2020-11-20 LAB — CBC
HCT: 27.7 % — ABNORMAL LOW (ref 36.0–46.0)
Hemoglobin: 8.8 g/dL — ABNORMAL LOW (ref 12.0–15.0)
MCH: 28.5 pg (ref 26.0–34.0)
MCHC: 31.8 g/dL (ref 30.0–36.0)
MCV: 89.6 fL (ref 80.0–100.0)
Platelets: 301 10*3/uL (ref 150–400)
RBC: 3.09 MIL/uL — ABNORMAL LOW (ref 3.87–5.11)
RDW: 15.4 % (ref 11.5–15.5)
WBC: 6.9 10*3/uL (ref 4.0–10.5)
nRBC: 0 % (ref 0.0–0.2)

## 2020-11-20 LAB — RENAL FUNCTION PANEL
Albumin: 2.7 g/dL — ABNORMAL LOW (ref 3.5–5.0)
Anion gap: 11 (ref 5–15)
BUN: 63 mg/dL — ABNORMAL HIGH (ref 8–23)
CO2: 27 mmol/L (ref 22–32)
Calcium: 9.4 mg/dL (ref 8.9–10.3)
Chloride: 95 mmol/L — ABNORMAL LOW (ref 98–111)
Creatinine, Ser: 2.43 mg/dL — ABNORMAL HIGH (ref 0.44–1.00)
GFR, Estimated: 21 mL/min — ABNORMAL LOW (ref >60)
Glucose, Bld: 115 mg/dL — ABNORMAL HIGH (ref 70–99)
Phosphorus: 4.2 mg/dL (ref 2.5–4.6)
Potassium: 4.4 mmol/L (ref 3.5–5.1)
Sodium: 133 mmol/L — ABNORMAL LOW (ref 135–145)

## 2020-11-20 LAB — SARS CORONAVIRUS 2 (TAT 6-24 HRS): SARS Coronavirus 2: NEGATIVE

## 2020-11-20 NOTE — Progress Notes (Signed)
[facility]  ROUNDING NOTE   Subjective:  Patient seen and evaluated at bedside. PermCath has now been removed. BUN did rise a bit to 63. Creatinine currently 2.4 with a EGFR 21. We did encourage the patient to increase fluid intake a bit today.   Objective:  Vital signs in last 24 hours:  Temperature 96.6 pulse 64 respirations 41 blood pressure 145/79  Physical Exam: General:  No acute distress  Head:  Normocephalic, atraumatic. Moist oral mucosal membranes  Eyes:  Anicteric  Neck:  Supple  Lungs:   Clear to auscultation, normal effort  Heart:  Z6X0 2/6 systolic ejection murmur  Abdomen:   Soft, nontender, bowel sounds present  Extremities:  Trace peripheral edema.  Neurologic:  Awake, alert, following commands  Skin:  No lesions  Access:  Right IJ PermCath removed.  Bandage in place.    Basic Metabolic Panel: Recent Labs  Lab 11/15/20 0435 11/18/20 0544 11/20/20 0442  NA 133* 134* 133*  K 3.7 4.3 4.4  CL 95* 97* 95*  CO2 _0 GLUCOSE 115* 107* 115*  BUN 43* 51* 63*  CREATININE 2.32* 2.38* 2.43*  CALCIUM 9.2 9.3 9.4  PHOS 4.3 4.5 4.2    Liver Function Tests: Recent Labs  Lab 11/15/20 0435 11/18/20 0544 11/20/20 0442  ALBUMIN 2.5* 2.7* 2.7*   No results for input(s): LIPASE, AMYLASE in the last 168 hours. No results for input(s): AMMONIA in the last 168 hours.  CBC: Recent Labs  Lab 11/15/20 0435 11/18/20 0544 11/20/20 0442  WBC 4.9 7.3 6.9  HGB 9.1* 9.1* 8.8*  HCT 28.0* 30.0* 27.7*  MCV 88.1 90.4 89.6  PLT 248 293 301    Cardiac Enzymes: No results for input(s): CKTOTAL, CKMB, CKMBINDEX, TROPONINI in the last 168 hours.  BNP: Invalid input(s): POCBNP  CBG: No results for input(s): GLUCAP in the last 168 hours.  Microbiology: Results for orders placed or performed during the hospital encounter of 11/06/20  Culture, Urine     Status: Abnormal   Collection Time: 11/07/20  4:17 PM   Specimen: Urine, Random  Result  Value Ref Range Status   Specimen Description URINE, RANDOM  Final   Special Requests   Final    NONE Performed at [facility], [place] [address], [place], [place] [address]    Culture (A)  Final    >=100,000 COLONIES/mL CITROBACTER KOSERI 30,000 COLONIES/mL PROTEUS MIRABILIS    Report Status 11/10/2020 FINAL  Final   Organism ID, Bacteria CITROBACTER KOSERI (A)  Final   Organism ID, Bacteria PROTEUS MIRABILIS (A)  Final      Susceptibility   Citrobacter koseri - MIC*    CEFAZOLIN <=4 SENSITIVE Sensitive     CEFEPIME <=0.12 SENSITIVE Sensitive     CEFTRIAXONE <=0.25 SENSITIVE Sensitive     CIPROFLOXACIN <=0.25 SENSITIVE Sensitive     GENTAMICIN <=1 SENSITIVE Sensitive     IMIPENEM 2 SENSITIVE Sensitive     NITROFURANTOIN 128 RESISTANT Resistant     TRIMETH/SULFA <=20 SENSITIVE Sensitive     PIP/TAZO <=4 SENSITIVE Sensitive     * >=100,000 COLONIES/mL CITROBACTER KOSERI   Proteus mirabilis - MIC*    AMPICILLIN >=32 RESISTANT Resistant     CEFAZOLIN <=4 SENSITIVE Sensitive     CEFEPIME <=0.12 SENSITIVE Sensitive     CEFTRIAXONE <=0.25 SENSITIVE Sensitive     CIPROFLOXACIN <=0.25 SENSITIVE Sensitive     GENTAMICIN <=1 SENSITIVE Sensitive     IMIPENEM <=0.25 SENSITIVE Sensitive  NITROFURANTOIN RESISTANT Resistant     TRIMETH/SULFA <=20 SENSITIVE Sensitive     AMPICILLIN/SULBACTAM 8 SENSITIVE Sensitive     PIP/TAZO 16 SENSITIVE Sensitive     * 30,000 COLONIES/mL PROTEUS MIRABILIS    Coagulation Studies: No results for input(s): LABPROT, INR in the last 72 hours.  Urinalysis: No results for input(s): COLORURINE, LABSPEC, PHURINE, GLUCOSEU, HGBUR, BILIRUBINUR, KETONESUR, PROTEINUR, UROBILINOGEN, NITRITE, LEUKOCYTESUR in the last 72 hours.  Invalid input(s): APPERANCEUR    Imaging: IR Removal Tun Cv Cath W/O FL  Result Date: 11/18/2020 INDICATION: Renal failure, resolved. Request removal of tunneled hemodialysis catheter originally placed at outside facility. EXAM:  REMOVAL OF TUNNELED RIGHT IJ HEMODIALYSIS CATHETER MEDICATIONS: 1% plain lidocaine, 8 mL COMPLICATIONS: None immediate. PROCEDURE: Informed written consent was obtained from the patient following an explanation of the procedure, risks, benefits and alternatives to treatment. A time out was performed prior to the initiation of the procedure. Maximal barrier sterile technique was utilized including caps, mask, sterile gowns, sterile gloves, large sterile drape, hand hygiene, and chlorhexidine. 1% lidocaine was injected under sterile conditions along the subcutaneous tunnel. Utilizing a combination of blunt dissection and gentle traction, the cuff of the catheter was exposed and the catheter was removed intact. Hemostasis was obtained with manual compression. A dressing was placed. The patient tolerated the procedure well without immediate post procedural complication. IMPRESSION: Successful removal of tunneled right IJ dialysis catheter. Read by: Ascencion Dike PA-C Electronically Signed   By: Jerilynn Mages.  Shick M.D.   On: 11/18/2020 12:07     Medications:       Assessment/ Plan:  69 y.o. female with a PMHx of acute on chronic hypercapnic respiratory failure, pulmonary embolism, anemia of chronic kidney disease, acute kidney injury, chronic kidney disease stage IIIb baseline creatinine 1.6, sacral ulcer, hypertension, history of pulmonary embolism, pulmonary hypertension, diabetes mellitus type 2, hyperlipidemia, morbid obesity, hypertension, history of breast cancer, who was admitted to [facility] on 11/06/2020 for ongoing care.  1.  Acute kidney injury/chronic kidney disease stage IIIb baseline creatinine 1.6/cardiorenal syndrome.  BUN up a bit to 63 with a creatinine of 2.4.  However good urine output of 1.1 L noted.  PermCath has been removed.  Encourage patient to increase fluid intake a bit.  Continue to monitor renal parameters.  2.  Anemia of chronic kidney disease.  Hemoglobin currently 8.8.   Continue to monitor CBC.  3.  Secondary hyperparathyroidism.  Phosphorus currently 4.2 and acceptable.  4.  Hyponatremia.  Serum sodium has been relatively stable and currently 133.  Continue to monitor serum sodium periodically.   LOS: 0 Ishmail Mcmanamon 12/22/20217:40 AM

## 2020-12-31 DEATH — deceased

## 2022-01-01 IMAGING — DX DG CHEST 1V PORT
1 series · 1 of 1 positions shown · non-contrast
Comparison: Portable chest 11/06/2020.

CLINICAL DATA: 69-year-old female with congestive heart failure.

EXAM:
PORTABLE CHEST 1 VIEW

[chest]
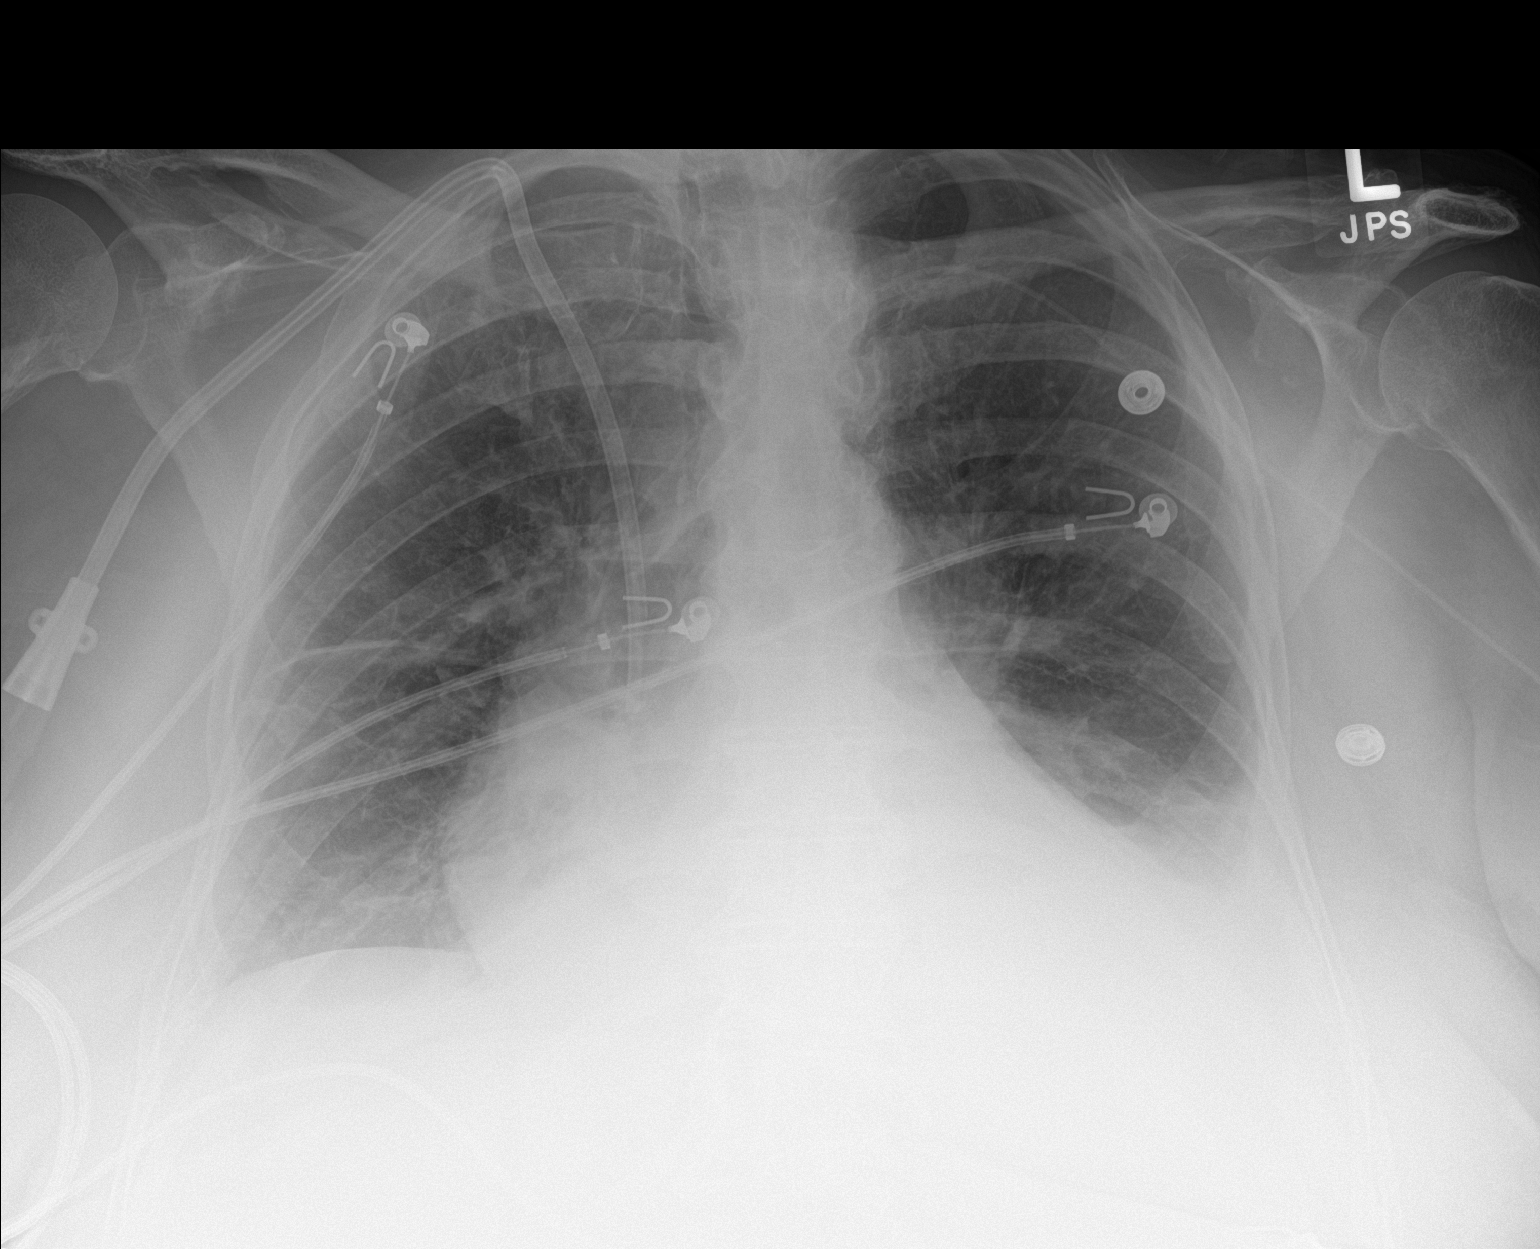

[1 of 1 positions shown; findings below may reference images not displayed]

FINDINGS: Portable AP upright view at 0058 hours. Stable right chest dual
lumen dialysis type catheter. Left PICC line appears stable.
Continued veiling and confluent opacity at the left lung base
compatible with small to moderate pleural effusion. Stable cardiac
size and mediastinal contours. No pneumothorax. Pulmonary
vascularity appears regressed, no overt edema. Trace pleural fluid
on the right. Stable visualized osseous structures.
IMPRESSION: 1. Continued small to moderate left pleural effusion. Trace right
pleural fluid.
2. Regressed pulmonary vascularity with no overt edema.
